# Patient Record
Sex: Female | Born: 1980 | Hispanic: No | Marital: Married | Smoking: Never smoker
Health system: Southern US, Community
[De-identification: ages and names within clinical notes are randomized; demographics above are authoritative.]

## PROBLEM LIST (undated history)

## (undated) DIAGNOSIS — Z789 Other specified health status: Secondary | ICD-10-CM

## (undated) HISTORY — PX: BACK SURGERY: SHX140

---

## 2017-10-27 ENCOUNTER — Other Ambulatory Visit: Payer: Self-pay | Admitting: Obstetrics and Gynecology

## 2017-10-27 ENCOUNTER — Other Ambulatory Visit (HOSPITAL_COMMUNITY)
Admission: RE | Admit: 2017-10-27 | Discharge: 2017-10-27 | Disposition: A | Discharge status: Home / Self Care | Payer: Self-pay | Source: Ambulatory Visit | Attending: Obstetrics and Gynecology | Admitting: Obstetrics and Gynecology

## 2017-10-27 DIAGNOSIS — Z01411 Encounter for gynecological examination (general) (routine) with abnormal findings: Secondary | ICD-10-CM | POA: Insufficient documentation

## 2017-10-29 LAB — CYTOLOGY - PAP
Chlamydia: NEGATIVE
DIAGNOSIS: NEGATIVE
HPV: NOT DETECTED
Neisseria Gonorrhea: NEGATIVE

## 2017-11-11 ENCOUNTER — Inpatient Hospital Stay (HOSPITAL_COMMUNITY): Admission: AD | Admit: 2017-11-11 | Payer: Self-pay | Source: Ambulatory Visit | Admitting: Obstetrics and Gynecology

## 2018-03-02 LAB — OB RESULTS CONSOLE ABO/RH: RH Type: POSITIVE

## 2018-03-02 LAB — OB RESULTS CONSOLE RPR: RPR: NONREACTIVE

## 2018-03-02 LAB — OB RESULTS CONSOLE ANTIBODY SCREEN: Antibody Screen: NEGATIVE

## 2018-03-02 LAB — OB RESULTS CONSOLE HEPATITIS B SURFACE ANTIGEN: Hepatitis B Surface Ag: NEGATIVE

## 2018-03-02 LAB — OB RESULTS CONSOLE HIV ANTIBODY (ROUTINE TESTING): HIV: NONREACTIVE

## 2018-03-02 LAB — OB RESULTS CONSOLE RUBELLA ANTIBODY, IGM: Rubella: IMMUNE

## 2018-05-01 LAB — OB RESULTS CONSOLE GBS: GBS: NEGATIVE

## 2018-05-18 ENCOUNTER — Other Ambulatory Visit: Payer: Self-pay

## 2018-05-18 ENCOUNTER — Encounter (HOSPITAL_COMMUNITY): Payer: Self-pay | Admitting: *Deleted

## 2018-05-18 ENCOUNTER — Other Ambulatory Visit: Payer: Self-pay | Admitting: Obstetrics and Gynecology

## 2018-05-18 ENCOUNTER — Inpatient Hospital Stay (HOSPITAL_COMMUNITY)
Admission: AD | Admit: 2018-05-18 | Discharge: 2018-05-22 | DRG: 788 | Disposition: A | Discharge status: Home / Self Care | Payer: BC Managed Care – PPO | Attending: Obstetrics and Gynecology | Admitting: Obstetrics and Gynecology

## 2018-05-18 DIAGNOSIS — Z3A39 39 weeks gestation of pregnancy: Secondary | ICD-10-CM | POA: Diagnosis not present

## 2018-05-18 DIAGNOSIS — O1404 Mild to moderate pre-eclampsia, complicating childbirth: Principal | ICD-10-CM | POA: Diagnosis present

## 2018-05-18 DIAGNOSIS — O99214 Obesity complicating childbirth: Secondary | ICD-10-CM | POA: Diagnosis present

## 2018-05-18 DIAGNOSIS — O1493 Unspecified pre-eclampsia, third trimester: Secondary | ICD-10-CM

## 2018-05-18 HISTORY — DX: Other specified health status: Z78.9

## 2018-05-18 LAB — COMPREHENSIVE METABOLIC PANEL
ALT: 18 U/L (ref 0–44)
ANION GAP: 9 (ref 5–15)
AST: 25 U/L (ref 15–41)
Albumin: 3 g/dL — ABNORMAL LOW (ref 3.5–5.0)
Alkaline Phosphatase: 87 U/L (ref 38–126)
BUN: 12 mg/dL (ref 6–20)
CO2: 21 mmol/L — ABNORMAL LOW (ref 22–32)
Calcium: 9.2 mg/dL (ref 8.9–10.3)
Chloride: 106 mmol/L (ref 98–111)
Creatinine, Ser: 0.6 mg/dL (ref 0.44–1.00)
GFR calc Af Amer: >60 mL/min (ref >60)
GFR calc non Af Amer: >60 mL/min (ref >60)
Glucose, Bld: 143 mg/dL — ABNORMAL HIGH (ref 70–99)
Potassium: 3.8 mmol/L (ref 3.5–5.1)
Sodium: 136 mmol/L (ref 135–145)
Total Bilirubin: 0.3 mg/dL (ref 0.3–1.2)
Total Protein: 6.2 g/dL — ABNORMAL LOW (ref 6.5–8.1)

## 2018-05-18 LAB — CBC
HCT: 40.4 % (ref 36.0–46.0)
Hemoglobin: 13.5 g/dL (ref 12.0–15.0)
MCH: 29.3 pg (ref 26.0–34.0)
MCHC: 33.4 g/dL (ref 30.0–36.0)
MCV: 87.6 fL (ref 80.0–100.0)
Platelets: 280 10*3/uL (ref 150–400)
RBC: 4.61 MIL/uL (ref 3.87–5.11)
RDW: 16.2 % — ABNORMAL HIGH (ref 11.5–15.5)
WBC: 11.1 10*3/uL — ABNORMAL HIGH (ref 4.0–10.5)
nRBC: 0 % (ref 0.0–0.2)

## 2018-05-18 LAB — PROTEIN / CREATININE RATIO, URINE
Creatinine, Urine: 142 mg/dL
Protein Creatinine Ratio: 0.32 mg/mg{Cre} — ABNORMAL HIGH (ref 0.00–0.15)
Total Protein, Urine: 45 mg/dL

## 2018-05-18 LAB — LACTATE DEHYDROGENASE: LDH: 137 U/L (ref 98–192)

## 2018-05-18 LAB — TYPE AND SCREEN
ABO/RH(D): A POS
ANTIBODY SCREEN: NEGATIVE

## 2018-05-18 LAB — URIC ACID: Uric Acid, Serum: 5.1 mg/dL (ref 2.5–7.1)

## 2018-05-18 LAB — ABO/RH: ABO/RH(D): A POS

## 2018-05-18 MED ORDER — OXYCODONE-ACETAMINOPHEN 5-325 MG PO TABS: 1.0000 | ORAL_TABLET | ORAL | Status: DC | PRN

## 2018-05-18 MED ORDER — PHENYLEPHRINE 40 MCG/ML (10ML) SYRINGE FOR IV PUSH (FOR BLOOD PRESSURE SUPPORT): 80.0000 ug | PREFILLED_SYRINGE | INTRAVENOUS | Status: DC | PRN

## 2018-05-18 MED ORDER — ONDANSETRON HCL 4 MG/2ML IJ SOLN: 4.0000 mg | Freq: Four times a day (QID) | INTRAMUSCULAR | Status: DC | PRN

## 2018-05-18 MED ORDER — LACTATED RINGERS IV SOLN
INTRAVENOUS | Status: DC
  Administered 2018-05-18: 125 mL/h via INTRAVENOUS
  Administered 2018-05-19 (×2): via INTRAVENOUS

## 2018-05-18 MED ORDER — PHENYLEPHRINE 40 MCG/ML (10ML) SYRINGE FOR IV PUSH (FOR BLOOD PRESSURE SUPPORT)
80.0000 ug | PREFILLED_SYRINGE | INTRAVENOUS | Status: DC | PRN
  Filled 2018-05-18: qty 10

## 2018-05-18 MED ORDER — FENTANYL CITRATE (PF) 100 MCG/2ML IJ SOLN
50.0000 ug | INTRAMUSCULAR | Status: DC | PRN
  Administered 2018-05-19 (×2): 100 ug via INTRAVENOUS
  Filled 2018-05-18 (×2): qty 2

## 2018-05-18 MED ORDER — LIDOCAINE HCL (PF) 1 % IJ SOLN: 30.0000 mL | INTRAMUSCULAR | Status: DC | PRN

## 2018-05-18 MED ORDER — DIPHENHYDRAMINE HCL 50 MG/ML IJ SOLN: 12.5000 mg | INTRAMUSCULAR | Status: DC | PRN

## 2018-05-18 MED ORDER — SOD CITRATE-CITRIC ACID 500-334 MG/5ML PO SOLN
30.0000 mL | ORAL | Status: DC | PRN
  Filled 2018-05-18: qty 15

## 2018-05-18 MED ORDER — TERBUTALINE SULFATE 1 MG/ML IJ SOLN
0.2500 mg | Freq: Once | INTRAMUSCULAR | Status: AC | PRN
  Administered 2018-05-19: 0.25 mg via SUBCUTANEOUS
  Filled 2018-05-18: qty 1

## 2018-05-18 MED ORDER — HYDRALAZINE HCL 20 MG/ML IJ SOLN: 10.0000 mg | INTRAMUSCULAR | Status: DC | PRN

## 2018-05-18 MED ORDER — LABETALOL HCL 5 MG/ML IV SOLN
20.0000 mg | INTRAVENOUS | Status: DC | PRN
  Administered 2018-05-19: 20 mg via INTRAVENOUS
  Filled 2018-05-18: qty 4

## 2018-05-18 MED ORDER — EPHEDRINE 5 MG/ML INJ: 10.0000 mg | INTRAVENOUS | Status: DC | PRN

## 2018-05-18 MED ORDER — ACETAMINOPHEN 325 MG PO TABS: 650.0000 mg | ORAL_TABLET | ORAL | Status: DC | PRN

## 2018-05-18 MED ORDER — FENTANYL 2.5 MCG/ML BUPIVACAINE 1/10 % EPIDURAL INFUSION (WH - ANES)
14.0000 mL/h | INTRAMUSCULAR | Status: DC | PRN
  Filled 2018-05-18: qty 100

## 2018-05-18 MED ORDER — MISOPROSTOL 25 MCG QUARTER TABLET
25.0000 ug | ORAL_TABLET | ORAL | Status: DC | PRN
  Administered 2018-05-18 – 2018-05-19 (×3): 25 ug via VAGINAL
  Filled 2018-05-18 (×3): qty 1

## 2018-05-18 MED ORDER — OXYTOCIN BOLUS FROM INFUSION: 500.0000 mL | Freq: Once | INTRAVENOUS | Status: DC

## 2018-05-18 MED ORDER — LABETALOL HCL 5 MG/ML IV SOLN: 80.0000 mg | INTRAVENOUS | Status: DC | PRN

## 2018-05-18 MED ORDER — LACTATED RINGERS IV SOLN: 500.0000 mL | Freq: Once | INTRAVENOUS | Status: DC

## 2018-05-18 MED ORDER — ZOLPIDEM TARTRATE 5 MG PO TABS: 5.0000 mg | ORAL_TABLET | Freq: Every day | ORAL | Status: DC

## 2018-05-18 MED ORDER — LACTATED RINGERS IV SOLN
500.0000 mL | INTRAVENOUS | Status: DC | PRN
  Administered 2018-05-19: 500 mL via INTRAVENOUS

## 2018-05-18 MED ORDER — LABETALOL HCL 5 MG/ML IV SOLN
40.0000 mg | INTRAVENOUS | Status: DC | PRN
  Administered 2018-05-19: 40 mg via INTRAVENOUS
  Filled 2018-05-18: qty 8

## 2018-05-18 MED ORDER — OXYTOCIN 40 UNITS IN NORMAL SALINE INFUSION - SIMPLE MED: 2.5000 [IU]/h | INTRAVENOUS | Status: DC

## 2018-05-18 MED ORDER — OXYCODONE-ACETAMINOPHEN 5-325 MG PO TABS: 2.0000 | ORAL_TABLET | ORAL | Status: DC | PRN

## 2018-05-18 NOTE — H&P (Signed)
Cristina Fisher is a 38 y.o. female G1 P0 at 39 wks and 4 days  presenting for induction of labor due to preeclampsia. Pt was seen in the office on 05/15/2018 at which time her bp was 140/88. PCR was 698. Short interval follow up was arranged for today and bp in the office today was 144/98. She denies headache visual disturbances or ruq pain. Pregnancy has been uncomplicated. Prenatal care provided by Dr. Gerald Leitzara Fuad Fisher with [facility]. . OB History    Gravida  1   Para      Term      Preterm      AB      Living        SAB      TAB      Ectopic      Multiple      Live Births             Past Medical History:  Diagnosis Date  . Medical history non-contributory    Past Surgical History:  Procedure Laterality Date  . BACK SURGERY     Family History: family history is not on file. Social History:  reports that she has never smoked. She does not have any smokeless tobacco history on file. She reports that she does not drink alcohol or use drugs.     Maternal Diabetes: No Genetic Screening: Normal Maternal Ultrasounds/Referrals: Normal Fetal Ultrasounds or other Referrals:  None Maternal Substance Abuse:  No Significant Maternal Medications:  None Significant Maternal Lab Results:  Lab values include: Group B Strep negative Other Comments:  None  ROS Maternal Medical History:  Fetal activity: Perceived fetal activity is normal.    Prenatal complications: Pre-eclampsia.   Prenatal Complications - Diabetes: none.      Blood pressure (!) 149/85, pulse (!) 101, temperature 98.6 F (37 C), temperature source Oral, resp. rate 16, height 5\' 3"  (1.6 m), weight 105.2 kg.   Fetal Exam Fetal Monitor Review: Baseline rate: 135.  Variability: moderate (6-25 bpm).   Pattern: accelerations present and no decelerations.    Fetal State Assessment: Category I - tracings are normal.     Physical Exam  Vitals reviewed. Constitutional: She is oriented to person, place, and  time. She appears well-developed and well-nourished.  HENT:  Head: Normocephalic and atraumatic.  Eyes: Pupils are equal, round, and reactive to light. Conjunctivae are normal.  Neck: Normal range of motion. Neck supple.  Cardiovascular: Normal rate and regular rhythm.  Respiratory: Effort normal and breath sounds normal.  GI: There is no abdominal tenderness.  Genitourinary:    Vagina normal.   Musculoskeletal: Normal range of motion.        General: Edema present.  Neurological: She is alert and oriented to person, place, and time. She has normal reflexes.  Skin: Skin is warm and dry.  Psychiatric: She has a normal mood and affect.   Cervix Closed thick and high vertex presentation.   Prenatal labs: ABO, Rh: --/--/A POS (01/20 1747) Antibody: PENDING (01/20 1747) Rubella: Immune (11/04 0000) RPR: Nonreactive (11/04 0000)  HBsAg: Negative (11/04 0000)  HIV: Non-reactive (11/04 0000)  GBS: Negative (01/03 0000)   Results for orders placed or performed during the hospital encounter of 05/18/18 (from the past 24 hour(s))  CBC     Status: Abnormal   Collection Time: 05/18/18  5:47 PM  Result Value Ref Range   WBC 11.1 (H) 4.0 - 10.5 K/uL   RBC 4.61 3.87 - 5.11 MIL/uL  Hemoglobin 13.5 12.0 - 15.0 g/dL   HCT 16.140.4 09.636.0 - 04.546.0 %   MCV 87.6 80.0 - 100.0 fL   MCH 29.3 26.0 - 34.0 pg   MCHC 33.4 30.0 - 36.0 g/dL   RDW 40.916.2 (H) 81.111.5 - 91.415.5 %   Platelets 280 150 - 400 K/uL   nRBC 0.0 0.0 - 0.2 %  Comprehensive metabolic panel     Status: Abnormal   Collection Time: 05/18/18  5:47 PM  Result Value Ref Range   Sodium 136 135 - 145 mmol/L   Potassium 3.8 3.5 - 5.1 mmol/L   Chloride 106 98 - 111 mmol/L   CO2 21 (L) 22 - 32 mmol/L   Glucose, Bld 143 (H) 70 - 99 mg/dL   BUN 12 6 - 20 mg/dL   Creatinine, Ser 7.820.60 0.44 - 1.00 mg/dL   Calcium 9.2 8.9 - 95.610.3 mg/dL   Total Protein 6.2 (L) 6.5 - 8.1 g/dL   Albumin 3.0 (L) 3.5 - 5.0 g/dL   AST 25 15 - 41 U/L   ALT 18 0 - 44 U/L    Alkaline Phosphatase 87 38 - 126 U/L   Total Bilirubin 0.3 0.3 - 1.2 mg/dL   GFR calc non Af Amer >60 >60 mL/min   GFR calc Af Amer >60 >60 mL/min   Anion gap 9 5 - 15  Lactate dehydrogenase     Status: None   Collection Time: 05/18/18  5:47 PM  Result Value Ref Range   LDH 137 98 - 192 U/L  Type and screen     Status: None (Preliminary result)   Collection Time: 05/18/18  5:47 PM  Result Value Ref Range   ABO/RH(D) A POS    Antibody Screen PENDING    Sample Expiration      05/21/2018 Performed at [facility], [address] [place], [place] [id number]   Uric acid     Status: None   Collection Time: 05/18/18  5:47 PM  Result Value Ref Range   Uric Acid, Serum 5.1 2.5 - 7.1 mg/dL   Assessment/Plan: 39 wks and 4 days with preeclampsia for inductio Cytotec for cervical ripening. First dose placed at  6pm.  Preeclampsia without severe features.. IV labetalol order in the event of severe range bp.. if this occurs start Magnesium sulfate.. plan magnesium sulfate post delivery for 24 hours.  [facility] .Dr. Su Fisher and Cristina Fisher LPJade Fisher CNM covering after 7pm.. I will assume care at 7 am tomorrow   Cristina Fisher 05/18/2018, 6:43 PM

## 2018-05-18 NOTE — Progress Notes (Addendum)
Labor Progress Note  Per HPI: [place] Hatz is a 38 y.o. female G1 P0 at 39 wks and 4 days  presenting for induction of labor due to preeclampsia. Pt was seen in the office on 05/15/2018 at which time her bp was 140/88. PCR was 698. Short interval follow up was arranged for today and bp in the office today was 144/98. She denies headache visual disturbances or ruq pain. Pregnancy has been uncomplicated. Prenatal care provided by Dr. Gerald Leitz with [facility].   Subjective: Pt resting quietly in bed with husband at bedside, reviewed risk and benefits or Cytotec and foley bulb, reviewed the induction process. Pt verbalized understanding, pt stable. Pt endorses feeling light cxt, but tolerates well.  Patient Active Problem List   Diagnosis Date Noted  . Preeclampsia, third trimester 05/18/2018   Objective: BP (!) 153/82   Pulse 90   Temp 98.9 F (37.2 C) (Oral)   Resp 18   Ht 5\' 3"  (1.6 m)   Wt 105.2 kg   BMI 41.10 kg/m  No intake/output data recorded. No intake/output data recorded. NST: FHR baseline 120 bpm, Variability: moderate, Accelerations:present, Decelerations:  Absent= Cat 1/Reactive CTX:  irregular, every 3.5-8 minutes, lasting 60-90 seconds Uterus gravid, soft non tender, moderate to palpate with contractions.  SVE:  Dilation: Fingertip Effacement (%): Thick Station: Ballotable Exam by:: Casey Burkitt, RN Pitocin at ( Not started yet) mUn/min  Assessment:  Joella Prince, 38 y.o., G1P0, with an IUP @ [redacted]w[redacted]d, presented for IOL for preeclampsia without SF. Pt stable and resting in bed, mild cxt felt.  Patient Active Problem List   Diagnosis Date Noted  . Preeclampsia, third trimester 05/18/2018   NICHD: Category 1  Membranes:  Intact, no s/s of infection  Induction:    Cytotec x1 @ 1800 & 2200 on 1/20  Foley Bulb: not yet  Pitocin - Not started   Pain management:               IV pain management: PRN  Nitrous: PRN             Epidural placement:  PRN  GBS  Negative   Preeclampsia without SF: BP 152/83, asymptomic  Plan: Continue labor plan Continuous/intermittent monitoring Rest Ambulate Frequent position changes to facilitate fetal rotation and descent. Will reassess with cervical exam as necessary Preeclampsia without SF: Monitor BP, labetalol protocol PRN, start magnesium if SF present with AH, or severe range BP >160/110, pt to received PP magnesium for 24 hours per Dr Richardson Dopp. Repeat labs in AM.  Plan to place Cytotec Q4H x3 as indicated, anticipate foley biulb and aticipate pitocin per protocol once cervix is ripe. Anticipate labor progression and vaginal delivery.   Md Su Hilt to be updated PRN  Dale [place], NP-C, CNM, MSN 05/19/2018. 12:09 AM

## 2018-05-19 ENCOUNTER — Encounter (HOSPITAL_COMMUNITY)
Admission: AD | Disposition: A | Discharge status: Home / Self Care | Payer: Self-pay | Source: Home / Self Care | Attending: Obstetrics and Gynecology

## 2018-05-19 ENCOUNTER — Inpatient Hospital Stay (HOSPITAL_COMMUNITY): Payer: BC Managed Care – PPO | Admitting: Anesthesiology

## 2018-05-19 ENCOUNTER — Other Ambulatory Visit: Payer: Self-pay

## 2018-05-19 ENCOUNTER — Encounter (HOSPITAL_COMMUNITY): Payer: Self-pay | Admitting: *Deleted

## 2018-05-19 LAB — COMPREHENSIVE METABOLIC PANEL WITH GFR
ALT: 18 U/L (ref 0–44)
AST: 32 U/L (ref 15–41)
Albumin: 2.7 g/dL — ABNORMAL LOW (ref 3.5–5.0)
Alkaline Phosphatase: 80 U/L (ref 38–126)
Anion gap: 9 (ref 5–15)
BUN: 10 mg/dL (ref 6–20)
CO2: 21 mmol/L — ABNORMAL LOW (ref 22–32)
Calcium: 7.9 mg/dL — ABNORMAL LOW (ref 8.9–10.3)
Chloride: 103 mmol/L (ref 98–111)
Creatinine, Ser: 0.75 mg/dL (ref 0.44–1.00)
GFR calc Af Amer: >60 mL/min
GFR calc non Af Amer: >60 mL/min
Glucose, Bld: 200 mg/dL — ABNORMAL HIGH (ref 70–99)
Potassium: 4 mmol/L (ref 3.5–5.1)
Sodium: 133 mmol/L — ABNORMAL LOW (ref 135–145)
Total Bilirubin: 0.5 mg/dL (ref 0.3–1.2)
Total Protein: 5.6 g/dL — ABNORMAL LOW (ref 6.5–8.1)

## 2018-05-19 LAB — CBC
HCT: 40.4 % (ref 36.0–46.0)
HCT: 38 % (ref 36.0–46.0)
Hemoglobin: 13.4 g/dL (ref 12.0–15.0)
Hemoglobin: 12.4 g/dL (ref 12.0–15.0)
MCH: 28.8 pg (ref 26.0–34.0)
MCH: 28.6 pg (ref 26.0–34.0)
MCHC: 33.2 g/dL (ref 30.0–36.0)
MCHC: 32.6 g/dL (ref 30.0–36.0)
MCV: 86.9 fL (ref 80.0–100.0)
MCV: 87.8 fL (ref 80.0–100.0)
Platelets: 239 10*3/uL (ref 150–400)
Platelets: 241 10*3/uL (ref 150–400)
RBC: 4.65 MIL/uL (ref 3.87–5.11)
RBC: 4.33 MIL/uL (ref 3.87–5.11)
RDW: 16.3 % — ABNORMAL HIGH (ref 11.5–15.5)
RDW: 16.1 % — ABNORMAL HIGH (ref 11.5–15.5)
WBC: 12.7 10*3/uL — ABNORMAL HIGH (ref 4.0–10.5)
WBC: 20 10*3/uL — ABNORMAL HIGH (ref 4.0–10.5)
nRBC: 0 % (ref 0.0–0.2)
nRBC: 0 % (ref 0.0–0.2)

## 2018-05-19 LAB — RPR: RPR Ser Ql: NONREACTIVE

## 2018-05-19 SURGERY — Surgical Case
Anesthesia: Epidural

## 2018-05-19 MED ORDER — KETOROLAC TROMETHAMINE 30 MG/ML IJ SOLN
30.0000 mg | Freq: Once | INTRAMUSCULAR | Status: AC | PRN
  Administered 2018-05-19: 30 mg via INTRAVENOUS

## 2018-05-19 MED ORDER — MORPHINE SULFATE (PF) 0.5 MG/ML IJ SOLN
INTRAMUSCULAR | Status: DC | PRN
  Administered 2018-05-19: 3 mg via EPIDURAL

## 2018-05-19 MED ORDER — SUCCINYLCHOLINE CHLORIDE 200 MG/10ML IV SOSY
PREFILLED_SYRINGE | INTRAVENOUS | Status: AC
  Filled 2018-05-19: qty 10

## 2018-05-19 MED ORDER — MIDAZOLAM HCL 2 MG/2ML IJ SOLN
INTRAMUSCULAR | Status: AC
  Filled 2018-05-19: qty 2

## 2018-05-19 MED ORDER — COCONUT OIL OIL: 1.0000 "application " | TOPICAL_OIL | Status: DC | PRN

## 2018-05-19 MED ORDER — PROPOFOL 10 MG/ML IV BOLUS
INTRAVENOUS | Status: DC | PRN
  Administered 2018-05-19: 200 mg via INTRAVENOUS

## 2018-05-19 MED ORDER — KETOROLAC TROMETHAMINE 30 MG/ML IJ SOLN
INTRAMUSCULAR | Status: AC
  Filled 2018-05-19: qty 1

## 2018-05-19 MED ORDER — MAGNESIUM SULFATE 40 G IN LACTATED RINGERS - SIMPLE
INTRAVENOUS | Status: AC
  Filled 2018-05-19: qty 500

## 2018-05-19 MED ORDER — EPHEDRINE 5 MG/ML INJ: 10.0000 mg | INTRAVENOUS | Status: DC | PRN

## 2018-05-19 MED ORDER — MORPHINE SULFATE (PF) 0.5 MG/ML IJ SOLN
INTRAMUSCULAR | Status: AC
  Filled 2018-05-19: qty 10

## 2018-05-19 MED ORDER — LABETALOL HCL 5 MG/ML IV SOLN: 20.0000 mg | INTRAVENOUS | Status: DC | PRN

## 2018-05-19 MED ORDER — HYDROMORPHONE HCL 1 MG/ML IJ SOLN: 0.2500 mg | INTRAMUSCULAR | Status: DC | PRN

## 2018-05-19 MED ORDER — ONDANSETRON HCL 4 MG/2ML IJ SOLN
INTRAMUSCULAR | Status: AC
  Filled 2018-05-19: qty 2

## 2018-05-19 MED ORDER — HYDRALAZINE HCL 20 MG/ML IJ SOLN: 10.0000 mg | INTRAMUSCULAR | Status: DC | PRN

## 2018-05-19 MED ORDER — ONDANSETRON HCL 4 MG/2ML IJ SOLN
INTRAMUSCULAR | Status: DC | PRN
  Administered 2018-05-19: 4 mg via INTRAVENOUS

## 2018-05-19 MED ORDER — LACTATED RINGERS IV SOLN
INTRAVENOUS | Status: DC | PRN
  Administered 2018-05-19 (×2): via INTRAVENOUS

## 2018-05-19 MED ORDER — SODIUM BICARBONATE 8.4 % IV SOLN
INTRAVENOUS | Status: AC
  Filled 2018-05-19: qty 50

## 2018-05-19 MED ORDER — PHENYLEPHRINE 40 MCG/ML (10ML) SYRINGE FOR IV PUSH (FOR BLOOD PRESSURE SUPPORT)
PREFILLED_SYRINGE | INTRAVENOUS | Status: AC
  Filled 2018-05-19: qty 110

## 2018-05-19 MED ORDER — FENTANYL CITRATE (PF) 250 MCG/5ML IJ SOLN
INTRAMUSCULAR | Status: DC | PRN
  Administered 2018-05-19: 100 ug via INTRAVENOUS

## 2018-05-19 MED ORDER — FENTANYL 2.5 MCG/ML BUPIVACAINE 1/10 % EPIDURAL INFUSION (WH - ANES): 14.0000 mL/h | INTRAMUSCULAR | Status: DC | PRN

## 2018-05-19 MED ORDER — ZOLPIDEM TARTRATE 5 MG PO TABS: 5.0000 mg | ORAL_TABLET | Freq: Every evening | ORAL | Status: DC | PRN

## 2018-05-19 MED ORDER — SCOPOLAMINE 1 MG/3DAYS TD PT72
MEDICATED_PATCH | TRANSDERMAL | Status: DC | PRN
  Administered 2018-05-19: 1 via TRANSDERMAL

## 2018-05-19 MED ORDER — SENNOSIDES-DOCUSATE SODIUM 8.6-50 MG PO TABS
2.0000 | ORAL_TABLET | ORAL | Status: DC
  Administered 2018-05-19 – 2018-05-20 (×2): 2 via ORAL
  Filled 2018-05-19 (×3): qty 2

## 2018-05-19 MED ORDER — DIBUCAINE 1 % RE OINT: 1.0000 "application " | TOPICAL_OINTMENT | RECTAL | Status: DC | PRN

## 2018-05-19 MED ORDER — ACETAMINOPHEN 10 MG/ML IV SOLN
1000.0000 mg | Freq: Once | INTRAVENOUS | Status: AC
  Administered 2018-05-19: 1000 mg via INTRAVENOUS

## 2018-05-19 MED ORDER — ONDANSETRON HCL 4 MG PO TABS: 4.0000 mg | ORAL_TABLET | Freq: Four times a day (QID) | ORAL | Status: DC | PRN

## 2018-05-19 MED ORDER — ONDANSETRON HCL 4 MG/2ML IJ SOLN: 4.0000 mg | Freq: Four times a day (QID) | INTRAMUSCULAR | Status: DC | PRN

## 2018-05-19 MED ORDER — PRENATAL MULTIVITAMIN CH
1.0000 | ORAL_TABLET | Freq: Every day | ORAL | Status: DC
  Administered 2018-05-20 – 2018-05-22 (×3): 1 via ORAL
  Filled 2018-05-19 (×3): qty 1

## 2018-05-19 MED ORDER — PHENYLEPHRINE 40 MCG/ML (10ML) SYRINGE FOR IV PUSH (FOR BLOOD PRESSURE SUPPORT): 80.0000 ug | PREFILLED_SYRINGE | INTRAVENOUS | Status: DC | PRN

## 2018-05-19 MED ORDER — OXYTOCIN 40 UNITS IN NORMAL SALINE INFUSION - SIMPLE MED: 2.5000 [IU]/h | INTRAVENOUS | Status: AC

## 2018-05-19 MED ORDER — FERROUS SULFATE 325 (65 FE) MG PO TABS
325.0000 mg | ORAL_TABLET | Freq: Two times a day (BID) | ORAL | Status: DC
  Administered 2018-05-19 – 2018-05-22 (×6): 325 mg via ORAL
  Filled 2018-05-19 (×6): qty 1

## 2018-05-19 MED ORDER — CEFAZOLIN SODIUM-DEXTROSE 2-4 GM/100ML-% IV SOLN
INTRAVENOUS | Status: AC
  Filled 2018-05-19: qty 100

## 2018-05-19 MED ORDER — MENTHOL 3 MG MT LOZG: 1.0000 | LOZENGE | OROMUCOSAL | Status: DC | PRN

## 2018-05-19 MED ORDER — DIPHENHYDRAMINE HCL 50 MG/ML IJ SOLN: 12.5000 mg | INTRAMUSCULAR | Status: DC | PRN

## 2018-05-19 MED ORDER — LACTATED RINGERS IV SOLN: 500.0000 mL | Freq: Once | INTRAVENOUS | Status: DC

## 2018-05-19 MED ORDER — DEXAMETHASONE SODIUM PHOSPHATE 10 MG/ML IJ SOLN
INTRAMUSCULAR | Status: AC
  Filled 2018-05-19: qty 1

## 2018-05-19 MED ORDER — OXYCODONE HCL 5 MG PO TABS
5.0000 mg | ORAL_TABLET | ORAL | Status: DC | PRN
  Administered 2018-05-20 – 2018-05-21 (×3): 10 mg via ORAL
  Administered 2018-05-22: 5 mg via ORAL
  Filled 2018-05-19: qty 2
  Filled 2018-05-19: qty 1
  Filled 2018-05-19 (×2): qty 2

## 2018-05-19 MED ORDER — PROMETHAZINE HCL 25 MG/ML IJ SOLN: 6.2500 mg | INTRAMUSCULAR | Status: DC | PRN

## 2018-05-19 MED ORDER — LABETALOL HCL 5 MG/ML IV SOLN: 40.0000 mg | INTRAVENOUS | Status: DC | PRN

## 2018-05-19 MED ORDER — HYDROMORPHONE HCL 1 MG/ML IJ SOLN: 0.2000 mg | INTRAMUSCULAR | Status: DC | PRN

## 2018-05-19 MED ORDER — MAGNESIUM SULFATE 40 G IN LACTATED RINGERS - SIMPLE
2.0000 g/h | INTRAVENOUS | Status: DC
  Administered 2018-05-19: 2 g/h via INTRAVENOUS

## 2018-05-19 MED ORDER — IBUPROFEN 800 MG PO TABS
800.0000 mg | ORAL_TABLET | Freq: Three times a day (TID) | ORAL | Status: AC
  Administered 2018-05-19 – 2018-05-22 (×9): 800 mg via ORAL
  Filled 2018-05-19 (×9): qty 1

## 2018-05-19 MED ORDER — CEFAZOLIN SODIUM-DEXTROSE 2-3 GM-%(50ML) IV SOLR
INTRAVENOUS | Status: DC | PRN
  Administered 2018-05-19: 2 g via INTRAVENOUS

## 2018-05-19 MED ORDER — PROPOFOL 10 MG/ML IV BOLUS
INTRAVENOUS | Status: AC
  Filled 2018-05-19: qty 20

## 2018-05-19 MED ORDER — MAGNESIUM SULFATE BOLUS VIA INFUSION
4.0000 g | Freq: Once | INTRAVENOUS | Status: AC
  Administered 2018-05-19: 4 g via INTRAVENOUS
  Filled 2018-05-19: qty 500

## 2018-05-19 MED ORDER — ACETAMINOPHEN 10 MG/ML IV SOLN
INTRAVENOUS | Status: AC
  Filled 2018-05-19: qty 100

## 2018-05-19 MED ORDER — WITCH HAZEL-GLYCERIN EX PADS: 1.0000 "application " | MEDICATED_PAD | CUTANEOUS | Status: DC | PRN

## 2018-05-19 MED ORDER — SODIUM CHLORIDE 0.9 % IR SOLN
Status: DC | PRN
  Administered 2018-05-19: 1000 mL

## 2018-05-19 MED ORDER — PHENYLEPHRINE HCL 10 MG/ML IJ SOLN
INTRAMUSCULAR | Status: DC | PRN
  Administered 2018-05-19 (×10): 80 ug via INTRAVENOUS

## 2018-05-19 MED ORDER — LACTATED RINGERS IV SOLN
INTRAVENOUS | Status: DC
  Administered 2018-05-19 – 2018-05-20 (×2): via INTRAVENOUS

## 2018-05-19 MED ORDER — DEXAMETHASONE SODIUM PHOSPHATE 10 MG/ML IJ SOLN
INTRAMUSCULAR | Status: DC | PRN
  Administered 2018-05-19: 5 mg via INTRAVENOUS

## 2018-05-19 MED ORDER — SIMETHICONE 80 MG PO CHEW
80.0000 mg | CHEWABLE_TABLET | ORAL | Status: DC
  Administered 2018-05-19 – 2018-05-22 (×3): 80 mg via ORAL
  Filled 2018-05-19 (×3): qty 1

## 2018-05-19 MED ORDER — SIMETHICONE 80 MG PO CHEW
80.0000 mg | CHEWABLE_TABLET | Freq: Three times a day (TID) | ORAL | Status: DC
  Administered 2018-05-19 – 2018-05-22 (×9): 80 mg via ORAL
  Filled 2018-05-19 (×9): qty 1

## 2018-05-19 MED ORDER — LABETALOL HCL 5 MG/ML IV SOLN: 80.0000 mg | INTRAVENOUS | Status: DC | PRN

## 2018-05-19 MED ORDER — SCOPOLAMINE 1 MG/3DAYS TD PT72
MEDICATED_PATCH | TRANSDERMAL | Status: AC
  Filled 2018-05-19: qty 1

## 2018-05-19 MED ORDER — FENTANYL CITRATE (PF) 250 MCG/5ML IJ SOLN
INTRAMUSCULAR | Status: AC
  Filled 2018-05-19: qty 5

## 2018-05-19 MED ORDER — SIMETHICONE 80 MG PO CHEW: 80.0000 mg | CHEWABLE_TABLET | ORAL | Status: DC | PRN

## 2018-05-19 MED ORDER — OXYTOCIN 10 UNIT/ML IJ SOLN
INTRAVENOUS | Status: DC | PRN
  Administered 2018-05-19: 40 [IU] via INTRAVENOUS

## 2018-05-19 MED ORDER — MEPERIDINE HCL 25 MG/ML IJ SOLN: 6.2500 mg | INTRAMUSCULAR | Status: DC | PRN

## 2018-05-19 MED ORDER — DIPHENHYDRAMINE HCL 25 MG PO CAPS: 25.0000 mg | ORAL_CAPSULE | Freq: Four times a day (QID) | ORAL | Status: DC | PRN

## 2018-05-19 MED ORDER — SUCCINYLCHOLINE CHLORIDE 20 MG/ML IJ SOLN
INTRAMUSCULAR | Status: DC | PRN
  Administered 2018-05-19: 200 mg via INTRAVENOUS

## 2018-05-19 MED ORDER — LIDOCAINE-EPINEPHRINE (PF) 2 %-1:200000 IJ SOLN
INTRAMUSCULAR | Status: AC
  Filled 2018-05-19: qty 20

## 2018-05-19 MED ORDER — LIDOCAINE-EPINEPHRINE (PF) 2 %-1:200000 IJ SOLN
INTRAMUSCULAR | Status: DC | PRN
  Administered 2018-05-19 (×2): 5 mL via EPIDURAL
  Administered 2018-05-19: 3 mL via EPIDURAL

## 2018-05-19 MED ORDER — STERILE WATER FOR IRRIGATION IR SOLN
Status: DC | PRN
  Administered 2018-05-19: 1000 mL

## 2018-05-19 SURGICAL SUPPLY — 43 items
BARRIER ADHS 3X4 INTERCEED (GAUZE/BANDAGES/DRESSINGS) ×2 IMPLANT
BENZOIN TINCTURE PRP APPL 2/3 (GAUZE/BANDAGES/DRESSINGS) ×2 IMPLANT
CANISTER SUCT 3000ML PPV (MISCELLANEOUS) ×2 IMPLANT
CHLORAPREP W/TINT 26ML (MISCELLANEOUS) ×2 IMPLANT
DRSG OPSITE POSTOP 4X10 (GAUZE/BANDAGES/DRESSINGS) ×2 IMPLANT
ELECT REM PT RETURN 9FT ADLT (ELECTROSURGICAL) ×2
ELECTRODE REM PT RTRN 9FT ADLT (ELECTROSURGICAL) ×1 IMPLANT
EXTRACTOR VACUUM KIWI (MISCELLANEOUS) ×2 IMPLANT
GAUZE SPONGE 4X4 12PLY STRL LF (GAUZE/BANDAGES/DRESSINGS) ×4 IMPLANT
GLOVE BIOGEL PI IND STRL 7.0 (GLOVE) ×2 IMPLANT
GLOVE BIOGEL PI IND STRL 7.5 (GLOVE) ×1 IMPLANT
GLOVE BIOGEL PI INDICATOR 7.0 (GLOVE) ×2
GLOVE BIOGEL PI INDICATOR 7.5 (GLOVE) ×1
GLOVE SKINSENSE NS SZ7.0 (GLOVE) ×1
GLOVE SKINSENSE STRL SZ7.0 (GLOVE) ×1 IMPLANT
GOWN STRL REUS W/ TWL LRG LVL3 (GOWN DISPOSABLE) ×2 IMPLANT
GOWN STRL REUS W/ TWL XL LVL3 (GOWN DISPOSABLE) ×1 IMPLANT
GOWN STRL REUS W/TWL LRG LVL3 (GOWN DISPOSABLE) ×2
GOWN STRL REUS W/TWL XL LVL3 (GOWN DISPOSABLE) ×1
NEEDLE HYPO 25X5/8 SAFETYGLIDE (NEEDLE) ×2 IMPLANT
NS IRRIG 1000ML POUR BTL (IV SOLUTION) ×2 IMPLANT
PACK C SECTION WH (CUSTOM PROCEDURE TRAY) ×2 IMPLANT
PAD ABD 7.5X8 STRL (GAUZE/BANDAGES/DRESSINGS) ×2 IMPLANT
PAD OB MATERNITY 4.3X12.25 (PERSONAL CARE ITEMS) ×2 IMPLANT
PAD PREP 24X48 CUFFED NSTRL (MISCELLANEOUS) ×2 IMPLANT
PENCIL SMOKE EVAC W/HOLSTER (ELECTROSURGICAL) ×2 IMPLANT
SPONGE LAP 18X18 RF (DISPOSABLE) ×6 IMPLANT
SPONGE LAP 18X18 X RAY DECT (DISPOSABLE) ×6 IMPLANT
STRIP CLOSURE SKIN 1/2X4 (GAUZE/BANDAGES/DRESSINGS) ×2 IMPLANT
SUT MNCRL 0 VIOLET CTX 36 (SUTURE) ×2 IMPLANT
SUT MON AB 4-0 PS1 27 (SUTURE) ×2 IMPLANT
SUT MONOCRYL 0 CTX 36 (SUTURE) ×2
SUT PDS AB 0 CT1 27 (SUTURE) ×4 IMPLANT
SUT PLAIN 2 0 XLH (SUTURE) ×2 IMPLANT
SUT VIC AB 0 CT1 36 (SUTURE) ×4 IMPLANT
SUT VIC AB 0 CTX 36 (SUTURE) ×3
SUT VIC AB 0 CTX36XBRD ANBCTRL (SUTURE) ×3 IMPLANT
SUT VIC AB 2-0 CT1 27 (SUTURE) ×1
SUT VIC AB 2-0 CT1 TAPERPNT 27 (SUTURE) ×1 IMPLANT
SUT VIC AB 3-0 CT1 27 (SUTURE) ×1
SUT VIC AB 3-0 CT1 TAPERPNT 27 (SUTURE) ×1 IMPLANT
SUT VIC AB 4-0 KS 27 (SUTURE) ×2 IMPLANT
TOWEL OR 17X24 6PK STRL BLUE (TOWEL DISPOSABLE) ×4 IMPLANT

## 2018-05-19 NOTE — Progress Notes (Signed)
Late entry  CTSP for prolonged decel. Fetus in the 43s for approximately 10-21m when I got there and had just been given terbutaline. No pitocin running, belly soft, BP normal and SVE complete/+1 to +2. Decision made to proceed with stat c-section and reasess in the room. In the OR on entry, fetus still in the 80s and about a 90 sec later just prior to prepping and draping, fetus still in the 80s-90s. Dr. Richardson Dopp present now and I assisted with the stat c-section. See op note for full details  Cornelia Copa MD Attending Center for [employer] (Faculty Practice) 05/19/2018 Time: [id number]

## 2018-05-19 NOTE — Transfer of Care (Addendum)
Immediate Anesthesia Transfer of Care Note  Patient: Cristina Fisher  Procedure(s) Performed: CESAREAN SECTION (N/A )  Patient Location: PACU  Anesthesia Type:General and epidural  Level of Consciousness: awake and alert   Airway & Oxygen Therapy: Patient Spontanous Breathing and Patient connected to nasal cannula oxygen  Post-op Assessment: Report given to RN and Post -op Vital signs reviewed and stable  Post vital signs: Reviewed and stable  Last Vitals:  Vitals Value Taken Time  BP 106/73 05/19/2018  9:20 AM  Temp    Pulse 105 05/19/2018  9:28 AM  Resp 22 05/19/2018  9:28 AM  SpO2 100 % 05/19/2018  9:28 AM  Vitals shown include unvalidated device data.  Last Pain:  Vitals:   05/19/18 0915  TempSrc:   PainSc: 0-No pain         Complications: No apparent anesthesia complications HR 108, RR 16, SaO2 100%, BP 106/73

## 2018-05-19 NOTE — Consult Note (Signed)
Neonatology Note:   Attendance at C-section:    I was asked by Dr. Cole to attend this CODE C/S at term for fetal bradycardia. The mother is a G1, GBS neg with good prenatal care complicated by IOL for pre-eclampsia. ROM 2 hours before delivery, fluid clear. Mother placed under general anesthesia.  HR in 80s just prior to delivery.  Infant not vigorous nor with good spontaneous cry and tone. Cord immediately cut and baby brought to warmer where he was dried and stimulated.  No response.  HR ~60-80. Bulb suctioned pharynx then CPAP toPPV given with good response in HR and resp effort. Sao2 placed and sats in 90s.  Fio2 weaned on CPAP to RA then off.  Baby vitals stable.    Ap 4/8/9. Lungs clear to ausc with good perfusion and appropriate mental status in DR. Father updated.  To CN to care of Pediatrician.  Milena Liggett C. Dwight Adamczak, MD  

## 2018-05-19 NOTE — Progress Notes (Signed)
Labor Progress Note  Per HPI: Cristina PrinceSara Gillen is a 38 y.o. female G1 P0 at 39 wks and 4 days  presenting for induction of labor due to preeclampsia. Pt was seen in the office on 05/15/2018 at which time her bp was 140/88. PCR was 698. Short interval follow up was arranged for today and bp in the office today was 144/98. She denies headache visual disturbances or ruq pain. Pregnancy has been uncomplicated. Prenatal care provided by Dr. Gerald Leitzara Cole with [facility].   Subjective: Pt in bed and endorses really starting to feel contractions now, pt requesting epidural now, I was standing next to pt and heard her water break, large amount, clear, fetus tolerated well. Pt stable. Early decelerations noted  Patient Active Problem List   Diagnosis Date Noted  . Preeclampsia, third trimester 05/18/2018   Objective: BP 132/69   Pulse 74   Temp 98.8 F (37.1 C) (Oral)   Resp 18   Ht 5\' 3"  (1.6 m)   Wt 105.2 kg   BMI 41.10 kg/m  No intake/output data recorded. No intake/output data recorded. NST: FHR baseline 120 bpm, Variability: moderate, Accelerations:present, Decelerations:  Absent= Cat 1/Reactive CTX:  irregular, every 2-3 minutes, lasting 60-90 seconds Uterus gravid, soft non tender, moderate to palpate with contractions.   SROM, large amount, clear fluids.   SVE:  Dilation: 7.5 Effacement (%): 80 Station: 0 Exam by:: [facility] Dontavion Noxon, CNM Pitocin at ( Not started yet) mUn/min  Assessment:  Cristina Fisher, 38 y.o., G1P0, with an IUP @ 6061w4d, presented for IOL for preeclampsia without SF. Pt stable and resting in bed, in pain with cxt, requesting epidural now. Transitioned to 7cm/80/0. Early decels noted around 5 am resolved. Pt in labor with out pictocin.  Patient Active Problem List   Diagnosis Date Noted  . Preeclampsia, third trimester 05/18/2018   NICHD: Category 1  Membranes:  SROM clear @ 0615 on 10/21, no s/s of infection  Induction:    Cytotec x1 @ 1800 & 2200 on 1/20  Foley Bulb: not  yet  Pitocin - Not started   Pain management:               IV pain management: Px1 IV fentanyl @ 0508  Nitrous: PRN             Epidural placement:  Requested now.   GBS Negative   Preeclampsia without SF: BP 132/69, asymptomic  Plan: Continue labor plan Continuous/intermittent monitoring Rest Ambulate Frequent position changes to facilitate fetal rotation and descent. Will reassess with cervical exam as necessary Preeclampsia without SF: Monitor BP, labetalol protocol PRN, start magnesium if SF present with AH, or severe range BP >160/110, pt to received PP magnesium for 24 hours per Dr Richardson Doppole. Repeat labs in AM.  Anticipate labor progression and vaginal delivery.   Will report off th Dr Richardson Doppole at [id number]  Arelie Kuzel, NP-C, CNM, MSN 05/19/2018. 6:29 AM

## 2018-05-19 NOTE — Anesthesia Procedure Notes (Signed)
Date/Time: 05/19/2018 7:54 AM Performed by: Lewie Loron, MD Pre-anesthesia Checklist: Emergency Drugs available, Patient identified, Suction available and Patient being monitored Patient Re-evaluated:Patient Re-evaluated prior to induction Induction Type: IV induction and Cricoid Pressure applied Laryngoscope Size: 3 Grade View: Grade I Tube type: Oral Number of attempts: 1 Airway Equipment and Method: Stylet Placement Confirmation: ETT inserted through vocal cords under direct vision,  positive ETCO2 and breath sounds checked- equal and bilateral Secured at: 21 cm Tube secured with: Tape Dental Injury: Teeth and Oropharynx as per pre-operative assessment

## 2018-05-19 NOTE — Anesthesia Postprocedure Evaluation (Signed)
Anesthesia Post Note  Patient: Cristina Fisher  Procedure(s) Performed: CESAREAN SECTION (N/A )     Patient location during evaluation: [facility] Anesthesia Type: Epidural Level of consciousness: awake and alert Pain management: pain level controlled Vital Signs Assessment: post-procedure vital signs reviewed and stable Respiratory status: spontaneous breathing, nonlabored ventilation and respiratory function stable Cardiovascular status: stable Postop Assessment: no apparent nausea or vomiting and adequate PO intake Anesthetic complications: no    Last Vitals:  Vitals:   05/19/18 1300 05/19/18 1400  BP: (!) 145/80 (!) 149/89  Pulse: 93 97  Resp: 20 20  Temp: 37 C 36.8 C  SpO2: 97% 99%    Last Pain:  Vitals:   05/19/18 1400  TempSrc: Oral  PainSc: 2    Pain Goal: Patients Stated Pain Goal: 2 (05/19/18 1400)              Epidural/Spinal Function Cutaneous sensation: Normal sensation (05/19/18 1400), Patient able to flex knees: Yes (05/19/18 1400), Patient able to lift hips off bed: Yes (05/19/18 1400), Back pain beyond tenderness at insertion site: No (05/19/18 1400), Progressively worsening motor and/or sensory loss: No (05/19/18 1400), Bowel and/or bladder incontinence post epidural: No (05/19/18 1400)  Yonna Alwin Hristova

## 2018-05-19 NOTE — Op Note (Signed)
Cesarean Section Procedure Note  Indications: Fetal Bradycardia. Pt sent to OR stat by Dr. Vergie Living due to fetal bradycardia to the 80's without return to baseline . Upon my arrival pt in OR and fetal heart rate 90's. I assumed care of Cristina Fisher and Performed cesarean section   Pre-operative Diagnosis: 39 week 5 day pregnancy.  Post-operative Diagnosis: same  Surgeon: Gerald Leitz M.D.  Assistants: Dr. Emelda Fear   Anesthesia: Spinal anesthesia  ASA Class: 2   Procedure Details   The patient was seen in the Holding Room. The risks, benefits, complications, treatment options, and expected outcomes were discussed with the patient.  The patient concurred with the proposed plan, giving informed consent.  The site of surgery properly noted/marked. The patient was taken to Operating Room # 9, identified as Cristina Fisher and the procedure verified as C-Section Delivery. A Time Out was held and the above information confirmed.  After induction of anesthesia, the patient was draped and prepped in the usual sterile manner. A Pfannenstiel incision was made and carried down through the subcutaneous tissue to the fascia. Fascial incision was made and extended transversely. The fascia was separated from the underlying rectus tissue superiorly and inferiorly. The peritoneum was identified and entered. Peritoneal incision was extended longitudinally. The utero-vesical peritoneal reflection was incised transversely and the bladder flap was bluntly freed from the lower uterine segment. A low transverse uterine incision was made. Delivered from cephalic presentation was a  Female with Apgar scores of 3 at one minute and 7 at five minutes 9 at 10 minutes . After the umbilical cord was clamped and cut cord blood was obtained for evaluation. The placenta was removed  And a true knot was noted. . The uterine outline, tubes and ovaries appeared normal. The uterine incision was closed with running locked sutures of 0 vicryl.  A second layer of 0 vicryl was used to imbricate the incision.. Hemostasis was observed. Lavage was carried out until clear. INterseed was placed along the uterine incision.  The fascia was then reapproximated with running sutures of 0 pds. . The skin was reapproximated with 4-0 vicryl.  IInstrument, sponge, and needle counts were correct prior the abdominal closure and at the conclusion of the case.   Findings: Female infant in the cephalic presentation. True knot was noted in the umbilical cord. Normal fallopian tubes and ovaries   Estimated Blood Loss:  318 mL         Drains: None         Total IV Fluids:   Per anesthesia ml         Specimens: Placenta and Disposition:  Sent to Pathology          Implants: None         Complications:  None; patient tolerated the procedure well.         Disposition: PACU - hemodynamically stable.         Condition: stable  Attending Attestation: I performed the procedure.

## 2018-05-19 NOTE — Lactation Note (Signed)
This note was copied from a baby's chart. Lactation Consultation Note  Patient Name: Cristina Fisher Date: 05/19/2018 Reason for consult: Initial assessment;Primapara  Initial visit at 6 hours of life. Mom is a P1 who reports + breast changes w/pregnancy.   Mom's 3rd floor RN brought to my attention that mother's nipple is too large for infant's mouth. I agree with that assessment. Hand expression was taught to Mom, but a nil amount was yielded. Mom understands that "Cristina Fisher" may not be able to feed at the breast at this time, but it is possible that he will adequately latch once he has grown some.   Mom chose for infant to go ahead and receive a bottle, as she would like to take a nap before beginning to pump. Infant fed with ease using the  Similac slow-flow nipple (yellow).   Plan:  1. Mom to start pumping q3hrs, as able. Size 30 flanges provided.  2. Infant to be bottle-fed EBM/formula.   Lurline [facility]are [facility] 05/19/2018, 2:40 PM

## 2018-05-19 NOTE — Anesthesia Procedure Notes (Signed)
Epidural Patient location during procedure: OB Start time: 05/19/2018 7:15 AM End time: 05/19/2018 7:28 AM  Staffing Anesthesiologist: Lewie Loron, MD Performed: anesthesiologist   Preanesthetic Checklist Completed: patient identified, pre-op evaluation, timeout performed, IV checked, risks and benefits discussed and monitors and equipment checked  Epidural Patient position: sitting Prep: site prepped and draped and DuraPrep Patient monitoring: heart rate, continuous pulse ox and blood pressure Approach: midline Location: L2-L3 Injection technique: LOR air and LOR saline  Needle:  Needle type: Tuohy  Needle gauge: 17 G Needle length: 9 cm Needle insertion depth: 8 cm Catheter type: closed end flexible Catheter size: 19 Gauge Catheter at skin depth: 13 cm Test dose: negative  Assessment Sensory level: T8 Events: blood not aspirated, injection not painful, no injection resistance, negative IV test and no paresthesia  Additional Notes Reason for block:procedure for pain

## 2018-05-19 NOTE — Anesthesia Preprocedure Evaluation (Signed)
Anesthesia Evaluation  Patient identified by MRN, date of birth, ID band Patient awake    Reviewed: Allergy & Precautions, NPO status , Patient's Chart, lab work & pertinent test results  Airway Mallampati: III  TM Distance: >3 FB Neck ROM: Full    Dental  (+) Dental Advisory Given   Pulmonary neg pulmonary ROS,    Pulmonary exam normal breath sounds clear to auscultation       Cardiovascular hypertension, Normal cardiovascular exam Rhythm:Regular Rate:Normal     Neuro/Psych negative neurological ROS  negative psych ROS   GI/Hepatic negative GI ROS, Neg liver ROS,   Endo/Other  Morbid obesity  Renal/GU negative Renal ROS     Musculoskeletal negative musculoskeletal ROS (+)   Abdominal (+) + obese,   Peds  Hematology negative hematology ROS (+)   Anesthesia Other Findings   Reproductive/Obstetrics (+) Pregnancy                             Anesthesia Physical Anesthesia Plan  ASA: III  Anesthesia Plan: Epidural   Post-op Pain Management:    Induction:   PONV Risk Score and Plan:   Airway Management Planned:   Additional Equipment:   Intra-op Plan:   Post-operative Plan:   Informed Consent: I have reviewed the patients History and Physical, chart, labs and discussed the procedure including the risks, benefits and alternatives for the proposed anesthesia with the patient or authorized representative who has indicated his/her understanding and acceptance.       Plan Discussed with:   Anesthesia Plan Comments:         Anesthesia Quick Evaluation

## 2018-05-19 NOTE — Anesthesia Pain Management Evaluation Note (Signed)
  [facility] Pain Management Visit Note  Patient: Cristina Fisher, 38 y.o., female  "Hello I am a member of the anesthesia team at [facility]. We have an anesthesia team available at all times to provide care throughout the hospital, including epidural management and anesthesia for C-section. I don't know your plan for the delivery whether it a natural birth, water birth, IV sedation, nitrous supplementation, doula or epidural, but we want to meet your pain goals."   1.Was your pain managed to your expectations on prior hospitalizations?   No prior hospitalizations  2.What is your expectation for pain management during this hospitalization?     Epidural  3.How can we help you reach that goal? Epidural being placed  Record the patient's initial score and the patient's pain goal.   Pain: 8  Pain Goal: 5 [facility] wants you to be able to say your pain was always managed very well.  Chase Arnall 05/19/2018

## 2018-05-20 LAB — CBC
HCT: 31.4 % — ABNORMAL LOW (ref 36.0–46.0)
Hemoglobin: 10.4 g/dL — ABNORMAL LOW (ref 12.0–15.0)
MCH: 29.2 pg (ref 26.0–34.0)
MCHC: 33.1 g/dL (ref 30.0–36.0)
MCV: 88.2 fL (ref 80.0–100.0)
Platelets: 237 10*3/uL (ref 150–400)
RBC: 3.56 MIL/uL — ABNORMAL LOW (ref 3.87–5.11)
RDW: 16.4 % — ABNORMAL HIGH (ref 11.5–15.5)
WBC: 14.4 10*3/uL — ABNORMAL HIGH (ref 4.0–10.5)
nRBC: 0 % (ref 0.0–0.2)

## 2018-05-20 MED ORDER — NIFEDIPINE ER OSMOTIC RELEASE 30 MG PO TB24
30.0000 mg | ORAL_TABLET | Freq: Every day | ORAL | Status: DC
  Administered 2018-05-20 – 2018-05-22 (×3): 30 mg via ORAL
  Filled 2018-05-20 (×3): qty 1

## 2018-05-20 MED ORDER — NIFEDIPINE ER OSMOTIC RELEASE 30 MG PO TB24: 30.0000 mg | ORAL_TABLET | Freq: Every day | ORAL | Status: DC

## 2018-05-20 NOTE — Lactation Note (Signed)
This note was copied from a baby's chart. Lactation Consultation Note  Patient Name: Cristina Fisher HQION'GToday's Date: 05/20/2018 Reason for consult: Follow-up assessment;Term;Primapara;1st time breastfeeding;Other (Comment)(magnesium)  P1 mother whose infant is now 2426 hours old.  Mother's nipples are too large for baby to latch at this time.  Mother is pumping and will bottle feed until baby grows.    Encouraged mother to pump at least every 3 hours and to feed back any EBM she receives to baby.  It has been 4 hours now since she last pumped.  Suggested she be very diligent about remembering to pump regularly.  Mother verbalized understanding.  Encouraged hand expression before/after pumping to help increase milk supply.  Reviewed feeding guideline sheet with mother.  At times, baby has been hard to feed according to the parents.  They stated that he sleeps all the time and is hard to awaken.  Tips and techniques discussed on how to awaken him and keep him awaken during feedings.  Educated parents that he is now >24 hours and will probably wake up more during the day today.  Stressed the importance of calling her RN at the 1200 feeding if baby does not consume the minimum amount needed.  Parents verbalized understanding.  Discussed obtaining a DEBP from her insurance company and father will call today for shipment of the pump.  Instructed to call for any further questions/concerns after discharge.   Maternal Data Formula Feeding for Exclusion: No Has patient been taught Hand Expression?: Yes Does the patient have breastfeeding experience prior to this delivery?: No  Feeding Feeding Type: Bottle Fed - Formula Nipple Type: Slow - flow  LATCH Score                   Interventions    Lactation Tools Discussed/Used WIC Program: No Initiated by:: Already initiated   Consult Status Consult Status: Follow-up Date: 05/21/18 Follow-up type: In-patient    Cristina Fisher  Cristina Fisher 05/20/2018, 10:04 AM

## 2018-05-20 NOTE — Progress Notes (Signed)
Inpatient Diabetes Program Recommendations  AACE/ADA: New Consensus Statement on Inpatient Glycemic Control (2015)  Target Ranges:  Prepandial:   less than 140 mg/dL      Peak postprandial:   less than 180 mg/dL (1-2 hours)      Critically ill patients:  140 - 180 mg/dL    Review of Glycemic Control Results for Cristina Fisher, Cristina Fisher (MRN [id number]) as of 05/20/2018 08:43  Ref. Range 05/19/2018 16:04  Glucose Latest Ref Range: 70 - 99 mg/dL 564 (H)      Outpatient Diabetes medications: none Current orders for Inpatient glycemic control: none  Inpatient Diabetes Program Recommendations:    Noted Decadron 5 mg x1, thus could explain increase to glucose trends.  However, given post partum serum glucose, consider adding Novolog 0-9 units & Novolog 0-5 units QHS along with subsequent CBGs.  Will continue to follow.  Thanks, Lujean Rave, MSN, RNC-OB Diabetes Coordinator [phone] (8a-5p)

## 2018-05-20 NOTE — Progress Notes (Signed)
Subjective: Postpartum Day 1: Cesarean Delivery Patient reports tolerating PO.  She denies headache visual disturbances or ruq pain.Marland Kitchen incisional pain is well controlled.  Objective: Vital signs in last 24 hours: Temp:  [97.6 F (36.4 C)-98.7 F (37.1 C)] 98.2 F (36.8 C) (01/22 1145) Pulse Rate:  [85-103] 103 (01/22 1145) Resp:  [16-20] 17 (01/22 1145) BP: (117-159)/(72-100) 125/79 (01/22 1145) SpO2:  [95 %-100 %] 98 % (01/22 1145)  Physical Exam:  General: alert, cooperative and no distress Lochia: appropriate Uterine Fundus: firm Incision: bandage clean dry and intact  DVT Evaluation: No evidence of DVT seen on physical exam.  Recent Labs    05/19/18 1604 05/20/18 0454  HGB 12.4 10.4*  HCT 38.0 31.4*    Assessment/Plan: Status post Cesarean section. Doing well postoperatively.  Preeclampsia- discontinue Magnesium sulfate .. will monitor bp if elevates plan on starting procardia XL 30 mg  Encourage ambulation  Pt may shower she is advised to remove outer dressing in the shower.. honeycomb dressing should remain in place  Saline lock IV .  Gerald Leitz 05/20/2018, 1:28 PM

## 2018-05-21 ENCOUNTER — Encounter (HOSPITAL_COMMUNITY): Payer: Self-pay | Admitting: Obstetrics and Gynecology

## 2018-05-21 MED ORDER — OXYCODONE HCL 5 MG PO TABS: 5.0000 mg | ORAL_TABLET | ORAL | 0 refills | Status: AC | PRN

## 2018-05-21 MED ORDER — IBUPROFEN 800 MG PO TABS: 800.0000 mg | ORAL_TABLET | Freq: Three times a day (TID) | ORAL | 1 refills | Status: AC | PRN

## 2018-05-21 MED ORDER — NIFEDIPINE ER 30 MG PO TB24: 30.0000 mg | ORAL_TABLET | Freq: Every day | ORAL | 1 refills | Status: AC

## 2018-05-21 NOTE — Progress Notes (Signed)
Subjective: Postpartum Day 2: Cesarean Delivery Patient reports incisional pain, tolerating PO, + flatus and no problems voiding.    Objective: Vital signs in last 24 hours: Temp:  [97.8 F (36.6 C)-99.1 F (37.3 C)] 98.8 F (37.1 C) (01/23 1646) Pulse Rate:  [90-110] 110 (01/23 1646) Resp:  [18-20] 20 (01/23 1646) BP: (123-153)/(74-88) 126/74 (01/23 1646) SpO2:  [99 %-100 %] 99 % (01/23 1646)  Physical Exam:  General: alert, cooperative and no distress Lochia: appropriate Uterine Fundus: firm Incision: bandage clean dry and intact  DVT Evaluation: No evidence of DVT seen on physical exam.  Recent Labs    05/19/18 1604 05/20/18 0454  HGB 12.4 10.4*  HCT 38.0 31.4*    Assessment/Plan: Status post Cesarean section. Doing well postoperatively.  Preeclampsia.Marland Kitchen continue procardia XL 30 mg daily bp well controlled.  D/c home tomorrow  Follow up in office in 1 week for bp check and in 2 weeks for incision check .  Gerald Leitz 05/21/2018, 5:57 PM

## 2018-05-21 NOTE — Lactation Note (Signed)
This note was copied from a baby's chart. Lactation Consultation Note  Patient Name: Cristina Fisher ZOXWR'UToday's Date: 05/21/2018 Reason for consult: Follow-up assessment Mom reports she is using DEBP every 3 hours/trying for 8 times day.  Baby Cristina now 52 hours and mom has not seen any milk.  Discussed increasing pumping to 10-12 times day and trying to get them in by pumping more often during the day.  Parents got their DEBP ordered for home use.  Parents report they chose a Spectra DEBP.  Urged parents to make sure they ordered the larger flanges to go with the breastpump.  Mom reports doing breast massage but not actually trying to remove any milk with her hands.  Asked mom if we could try some hand expression.  Unable to hand express and remove anything on left breast.  Moves to right breast.  Mom with notable areolar swelling and tissue hard and not compressable. However, able to remove some drops of colostrum from right breast,.  Mom reprots that she is happy and wants to keep trying with the hand expression.  Urged mom to massage areolar tissue and nipple also when massaging the breasts prior to pumping. Showed parents [employer] Hand Expression Hands on Pumping video. Mom reports her goal is to breastfeed. Urged her to get in good milk production/supply so that when baby is ready to go there he will have plenty of milk.  Discussed continuing STS even though baby not latching right now.  Urged STS prior to pumping.  Urged heat right before and with pumping.Urged mom to look at baby when she is pumping.  Praised moms efforts.   Urged her to contact lactation as needed.  Maternal Data Has patient been taught Hand Expression?: Yes  Feeding Feeding Type: Breast Milk Nipple Type: Slow - flow  LATCH Score                   Interventions    Lactation Tools Discussed/Used     Consult Status Consult Status: Follow-up Date: 05/22/18 Follow-up type: In-patient    [facility] Michaelle CopasS  Layah Skousen 05/21/2018, 12:08 PM

## 2018-05-21 NOTE — Progress Notes (Signed)
CSW received consult due to score 10 on Edinburgh Depression Screen.    CSW met with MOB in room 309 to complete an assessment for 10 on Edinburgh.  When CSW arrived, MOB was bonding with infant as evidence by engaging in skin to skin. CSW explained CSW's role and MOB gave CSW permission to complete assessment while FOB present.  FOB appeared supportive and communicated a desire to continue to support MOB and infant postpartum. MOB was polite, easy to engage, and receptive to meeting with CSW. MOB expressed feeling "useless" due to recovery process with c-section.    CSW provided education regarding Baby Blues vs PMADs and provided MOB with resources for mental health follow up.  CSW encouraged MOB to evaluate her mental health throughout the postpartum period with the use of the New Mom Checklist developed by Postpartum [facility] as well as the [place] Postnatal Depression Scale and notify a medical professional if symptoms arise.  CSW assessed for safety and MOB denied, SI and HI. MOB reported having all essential items and feeling prepared to parent.   There are on barriers to discharge.  Laurey Arrow, MSW, LCSW Clinical Social Work [phone]

## 2018-05-22 ENCOUNTER — Encounter (HOSPITAL_COMMUNITY): Payer: Self-pay | Admitting: *Deleted

## 2018-05-22 NOTE — Progress Notes (Signed)
Discharge instructions reviewed with patient using the Mother and Baby Care Book.  Parents were receptive to RN and are read for discharge.

## 2018-05-22 NOTE — Discharge Summary (Signed)
OB Discharge Summary     Patient Name: Cristina Fisher DOB: [birth year] MRN: [id number]  Date of admission: 05/18/2018 Delivering MD: Gerald LeitzOLE, TARA   Date of discharge: 05/22/2018  Admitting diagnosis: 39wks Induction Intrauterine pregnancy: 10438w5d     Secondary diagnosis:  Active Problems:   Preeclampsia, third trimester  Additional problems: None     Discharge diagnosis: Term Pregnancy Delivered and Preeclampsia (mild)                                                                                                Post partum procedures:Magnesium x 24 hours, Started Procardia XL 30 mg  Augmentation: Cytotec  Complications: None  Hospital course:  Induction of Labor With Cesarean Section  38 y.o. yo G1P1001 at 4238w5d was admitted to the hospital 05/18/2018 for induction of labor. Patient had a labor course significant for fetal distress. The patient went for stat cesarean section due to Fetal bradycardia, and delivered a Viable infant,05/19/2018  Membrane Rupture Time/Date: 6:10 AM ,05/19/2018   Details of operation can be found in separate operative Note.  Patient had an uncomplicated postpartum course. She is ambulating, tolerating a regular diet, passing flatus, and urinating well.  Patient is discharged home in stable condition on 05/23/18.                                    Physical exam  Vitals:   05/22/18 0009 05/22/18 0412 05/22/18 0918 05/22/18 1356  BP: (!) 145/85 (!) 144/92 (!) 147/83 (!) 142/91  Pulse: (!) 101 88 (!) 101 97  Resp:  17 18   Temp:  97.8 F (36.6 C) 98.2 F (36.8 C)   TempSrc:  Oral Oral   SpO2:  98% 99%   Weight:      Height:       General: alert, cooperative and no distress Lochia: appropriate Uterine Fundus: firm Incision: Dressing is clean, dry, and intact DVT Evaluation: No evidence of DVT seen on physical exam. Calf/Ankle edema is present Labs: Lab Results  Component Value Date   WBC 14.4 (H) 05/20/2018   HGB 10.4 (L) 05/20/2018   HCT 31.4 (L)  05/20/2018   MCV 88.2 05/20/2018   PLT 237 05/20/2018   CMP Latest Ref Rng & Units 05/19/2018  Glucose 70 - 99 mg/dL 244(W200(H)  BUN 6 - 20 mg/dL 10  Creatinine 1.020.44 - 7.251.00 mg/dL 3.660.75  Sodium 440135 - 347145 mmol/L 133(L)  Potassium 3.5 - 5.1 mmol/L 4.0  Chloride 98 - 111 mmol/L 103  CO2 22 - 32 mmol/L 21(L)  Calcium 8.9 - 10.3 mg/dL 7.9(L)  Total Protein 6.5 - 8.1 g/dL 4.2(V5.6(L)  Total Bilirubin 0.3 - 1.2 mg/dL 0.5  Alkaline Phos 38 - 126 U/L 80  AST 15 - 41 U/L 32  ALT 0 - 44 U/L 18    Discharge instruction: per After Visit Summary and "Baby and Me Booklet".  After visit meds:  Allergies as of 05/22/2018   No Known Allergies     Medication List  STOP taking these medications   aspirin EC 81 MG tablet     TAKE these medications   ibuprofen 800 MG tablet Commonly known as:  ADVIL,MOTRIN Take 1 tablet (800 mg total) by mouth every 8 (eight) hours as needed.   NIFEdipine 30 MG 24 hr tablet Commonly known as:  ADALAT CC Take 1 tablet (30 mg total) by mouth daily.   oxyCODONE 5 MG immediate release tablet Commonly known as:  Oxy IR/ROXICODONE Take 1-2 tablets (5-10 mg total) by mouth every 4 (four) hours as needed for moderate pain.   prenatal multivitamin Tabs tablet Take 1 tablet by mouth daily at 12 noon.       Diet: routine diet  Activity: Advance as tolerated. Pelvic rest for 6 weeks.   Outpatient follow up:< 1 week for BP check Follow up Appt:No future appointments. Follow up Visit:No follow-ups on file.  Postpartum contraception: Discussed  Newborn Data: Live born female  Birth Weight: 6 lb 1.4 oz (2760 g) APGAR: 4, 8  Newborn Delivery   Birth date/time:  05/19/2018 07:58:00 Delivery type:  C-Section, Low Transverse Trial of labor:  Yes C-section categorization:  Primary     Baby Feeding: Breast Disposition:home with mother   05/22/2018 Geryl Rankins, MD

## 2018-05-22 NOTE — Lactation Note (Signed)
This note was copied from a baby's chart. Lactation Consultation Note: Dad asked me to see mom as baby was skin to skin and rooting. Assisted mom with positioning baby at the breast in football hold. Baby on and off the breast as mom has large nipples. On and off the breast for 5 min then going off to sleep. Parents encouraged that baby was trying to breast feed. Mom reports breasts are feeling heavier this morning. Pumped for 15 min and obtained 35 ml transitional milk. Encouraged to try to breast feed then give EBM and formula if needed and to pump after feedings. Dad bottle fed formula as baby woke up and was fussy when dad laid him in bassinet. Dad plans to drive to [place] today to get pump from insurance,. No questions at present. Reviewed our phone number, OP appointments and BFS[facility] as resources for support after DC . Encouraged to make OP appointment for assist with latch when baby grows and able to latch better.   Patient Name: Cristina Fisher EXNTZ'G Date: 05/22/2018 Reason for consult: Follow-up assessment;1st time breastfeeding   Maternal Data Formula Feeding for Exclusion: No Has patient been taught Hand Expression?: Yes Does the patient have breastfeeding experience prior to this delivery?: No  Feeding Feeding Type: Bottle Fed - Formula Nipple Type: Slow - flow  LATCH Score Latch: Repeated attempts needed to sustain latch, nipple held in mouth throughout feeding, stimulation needed to elicit sucking reflex.  Audible Swallowing: None  Type of Nipple: Everted at rest and after stimulation  Comfort (Breast/Nipple): Soft / non-tender  Hold (Positioning): Assistance needed to correctly position infant at breast and maintain latch.  LATCH Score: 6  Interventions Interventions: Breast feeding basics reviewed;DEBP;Expressed milk;Hand express;Breast compression  Lactation Tools Discussed/Used WIC Program: No Pump Review: Setup, frequency, and cleaning   Consult  Status Consult Status: Complete    Pamelia Hoit 05/22/2018, 9:01 AM

## 2018-05-22 NOTE — Discharge Instructions (Signed)
Cesarean Delivery, Care After °This sheet gives you information about how to care for yourself after your procedure. Your health care provider may also give you more specific instructions. If you have problems or questions, contact your health care provider. °What can I expect after the procedure? °After the procedure, it is common to have: °· A small amount of blood or clear fluid coming from the incision. °· Some redness, swelling, and pain in your incision area. °· Some abdominal pain and soreness. °· Vaginal bleeding (lochia). Even though you did not have a vaginal delivery, you will still have vaginal bleeding and discharge. °· Pelvic cramps. °· Fatigue. °You may have pain, swelling, and discomfort in the tissue between your vagina and your anus (perineum) if: °· Your C-section was unplanned, and you were allowed to labor and push. °· An incision was made in the area (episiotomy) or the tissue tore during attempted vaginal delivery. °Follow these instructions at home: °Incision care ° °· Follow instructions from your health care provider about how to take care of your incision. Make sure you: °? Wash your hands with soap and water before you change your bandage (dressing). If soap and water are not available, use hand sanitizer. °? If you have a dressing, change it or remove it as told by your health care provider. °? Leave stitches (sutures), skin staples, skin glue, or adhesive strips in place. These skin closures may need to stay in place for 2 weeks or longer. If adhesive strip edges start to loosen and curl up, you may trim the loose edges. Do not remove adhesive strips completely unless your health care provider tells you to do that. °· Check your incision area every day for signs of infection. Check for: °? More redness, swelling, or pain. °? More fluid or blood. °? Warmth. °? Pus or a bad smell. °· Do not take baths, swim, or use a hot tub until your health care provider says it's okay. Ask your health  care provider if you can take showers. °· When you cough or sneeze, hug a pillow. This helps with pain and decreases the chance of your incision opening up (dehiscing). Do this until your incision heals. °Medicines °· Take over-the-counter and prescription medicines only as told by your health care provider. °· If you were prescribed an antibiotic medicine, take it as told by your health care provider. Do not stop taking the antibiotic even if you start to feel better. °· Do not drive or use heavy machinery while taking prescription pain medicine. °Lifestyle °· Do not drink alcohol. This is especially important if you are breastfeeding or taking pain medicine. °· Do not use any products that contain nicotine or tobacco, such as cigarettes, e-cigarettes, and chewing tobacco. If you need help quitting, ask your health care provider. °Eating and drinking °· Drink at least 8 eight-ounce glasses of water every day unless told not to by your health care provider. If you breastfeed, you may need to drink even more water. °· Eat high-fiber foods every day. These foods may help prevent or relieve constipation. High-fiber foods include: °? Whole grain cereals and breads. °? Brown rice. °? Beans. °? Fresh fruits and vegetables. °Activity ° °· If possible, have someone help you care for your baby and help with household activities for at least a few days after you leave the hospital. °· Return to your normal activities as told by your health care provider. Ask your health care provider what activities are safe for   you. °· Rest as much as possible. Try to rest or take a nap while your baby is sleeping. °· Do not lift anything that is heavier than 10 lbs (4.5 kg), or the limit that you were told, until your health care provider says that it is safe. °· Talk with your health care provider about when you can engage in sexual activity. This may depend on your: °? Risk of infection. °? How fast you heal. °? Comfort and desire to  engage in sexual activity. °General instructions °· Do not use tampons or douches until your health care provider approves. °· Wear loose, comfortable clothing and a supportive and well-fitting bra. °· Keep your perineum clean and dry. Wipe from front to back when you use the toilet. °· If you pass a blood clot, save it and call your health care provider to discuss. Do not flush blood clots down the toilet before you get instructions from your health care provider. °· Keep all follow-up visits for you and your baby as told by your health care provider. This is important. °Contact a health care provider if: °· You have: °? A fever. °? Bad-smelling vaginal discharge. °? Pus or a bad smell coming from your incision. °? Difficulty or pain when urinating. °? A sudden increase or decrease in the frequency of your bowel movements. °? More redness, swelling, or pain around your incision. °? More fluid or blood coming from your incision. °? A rash. °? Nausea. °? Little or no interest in activities you used to enjoy. °? Questions about caring for yourself or your baby. °· Your incision feels warm to the touch. °· Your breasts turn red or become painful or hard. °· You feel unusually sad or worried. °· You vomit. °· You pass a blood clot from your vagina. °· You urinate more than usual. °· You are dizzy or light-headed. °Get help right away if: °· You have: °? Pain that does not go away or get better with medicine. °? Chest pain. °? Difficulty breathing. °? Blurred vision or spots in your vision. °? Thoughts about hurting yourself or your baby. °? New pain in your abdomen or in one of your legs. °? A severe headache. °· You faint. °· You bleed from your vagina so much that you fill more than one sanitary pad in one hour. Bleeding should not be heavier than your heaviest period. °Summary °· After the procedure, it is common to have pain at your incision site, abdominal cramping, and slight bleeding from your vagina. °· Check  your incision area every day for signs of infection. °· Tell your health care provider about any unusual symptoms. °· Keep all follow-up visits for you and your baby as told by your health care provider. °This information is not intended to replace advice given to you by your health care provider. Make sure you discuss any questions you have with your health care provider. °Document Released: 01/05/2002 Document Revised: 10/22/2017 Document Reviewed: 10/22/2017 °Elsevier Interactive Patient Education © 2019 [employer] ° °Preeclampsia and Eclampsia ° °Preeclampsia is a serious condition that may develop during pregnancy. It is also called toxemia of pregnancy. This condition causes high blood pressure along with other symptoms, such as swelling and headaches. These symptoms may develop as the condition gets worse. Preeclampsia may occur at 20 weeks of pregnancy or later. °Diagnosing and treating preeclampsia early is very important. If not treated early, it can cause serious problems for you and your baby. One problem it   can lead to is eclampsia. Eclampsia is a condition that causes muscle jerking or shaking (convulsions or seizures) and other serious problems for the mother. During pregnancy, delivering your baby may be the best treatment for preeclampsia or eclampsia. For most women, preeclampsia and eclampsia symptoms go away after giving birth. °In rare cases, a woman may develop preeclampsia after giving birth (postpartum preeclampsia). This usually occurs within 48 hours after childbirth but may occur up to 6 weeks after giving birth. °What are the causes? °The cause of preeclampsia is not known. °What increases the risk? °The following risk factors make you more likely to develop preeclampsia: °· Being pregnant for the first time. °· Having had preeclampsia during a past pregnancy. °· Having a family history of preeclampsia. °· Having high blood pressure. °· Being pregnant with more than one baby. °· Being  35 or older. °· Being African-American. °· Having kidney disease or diabetes. °· Having medical conditions such as lupus or blood diseases. °· Being very overweight (obese). °What are the signs or symptoms? °The earliest signs of preeclampsia are: °· High blood pressure. °· Increased protein in your urine. Your health care provider will check for this at every visit before you give birth (prenatal visit). °Other symptoms that may develop as the condition gets worse include: °· Severe headaches. °· Sudden weight gain. °· Swelling of the hands, face, legs, and feet. °· Nausea and vomiting. °· Vision problems, such as blurred or double vision. °· Numbness in the face, arms, legs, and feet. °· Urinating less than usual. °· Dizziness. °· Slurred speech. °· Abdominal pain, especially upper abdominal pain. °· Convulsions or seizures. °How is this diagnosed? °There are no screening tests for preeclampsia. Your health care provider will ask you about symptoms and check for signs of preeclampsia during your prenatal visits. You may also have tests that include: °· Urine tests. °· Blood tests. °· Checking your blood pressure. °· Monitoring your baby’s heart rate. °· Ultrasound. °How is this treated? °You and your health care provider will determine the treatment approach that is best for you. Treatment may include: °· Having more frequent prenatal exams to check for signs of preeclampsia, if you have an increased risk for preeclampsia. °· Medicine to lower your blood pressure. °· Staying in the hospital, if your condition is severe. There, treatment will focus on controlling your blood pressure and the amount of fluids in your body (fluid retention). °· Taking medicine (magnesium sulfate) to prevent seizures. This may be given as an injection or through an IV. °· Taking a low-dose aspirin during your pregnancy. °· Delivering your baby early, if your condition gets worse. You may have your labor started with medicine (induced),  or you may have a cesarean delivery. °Follow these instructions at home: °Eating and drinking ° °· Drink enough fluid to keep your urine pale yellow. °· Avoid caffeine. °Lifestyle °· Do not use any products that contain nicotine or tobacco, such as cigarettes and e-cigarettes. If you need help quitting, ask your health care provider. °· Do not use alcohol or drugs. °· Avoid stress as much as possible. Rest and get plenty of sleep. °General instructions °· Take over-the-counter and prescription medicines only as told by your health care provider. °· When lying down, lie on your left side. This keeps pressure off your major blood vessels. °· When sitting or lying down, raise (elevate) your feet. Try putting some pillows underneath your lower legs. °· Exercise regularly. Ask your health care provider what   kinds of exercise are best for you. °· Keep all follow-up and prenatal visits as told by your health care provider. This is important. °How is this prevented? °There is no known way of preventing preeclampsia or eclampsia from developing. However, to lower your risk of complications and detect problems early: °· Get regular prenatal care. Your health care provider may be able to diagnose and treat the condition early. °· Maintain a healthy weight. Ask your health care provider for help managing weight gain during pregnancy. °· Work with your health care provider to manage any long-term (chronic) health conditions you have, such as diabetes or kidney problems. °· You may have tests of your blood pressure and kidney function after giving birth. °· Your health care provider may have you take low-dose aspirin during your next pregnancy. °Contact a health care provider if: °· You have symptoms that your health care provider told you may require more treatment or monitoring, such as: °? Headaches. °? Nausea or vomiting. °? Abdominal pain. °? Dizziness. °? Light-headedness. °Get help right away if: °· You have  severe: °? Abdominal pain. °? Headaches that do not get better. °? Dizziness. °? Vision problems. °? Confusion. °? Nausea or vomiting. °· You have any of the following: °? A seizure. °? Sudden, rapid weight gain. °? Sudden swelling in your hands, ankles, or face. °? Trouble moving any part of your body. °? Numbness in any part of your body. °? Trouble speaking. °? Abnormal bleeding. °· You faint. °Summary °· Preeclampsia is a serious condition that may develop during pregnancy. It is also called toxemia of pregnancy. °· This condition causes high blood pressure along with other symptoms, such as swelling and headaches. °· Diagnosing and treating preeclampsia early is very important. If not treated early, it can cause serious problems for you and your baby. °· Get help right away if you have symptoms that your health care provider told you to watch for. °This information is not intended to replace advice given to you by your health care provider. Make sure you discuss any questions you have with your health care provider. °Document Released: 04/12/2000 Document Revised: 04/01/2017 Document Reviewed: 11/20/2015 °Elsevier Interactive Patient Education © 2019 [employer] ° °

## 2018-05-25 ENCOUNTER — Encounter (HOSPITAL_COMMUNITY): Payer: Self-pay | Admitting: Obstetrics and Gynecology

## 2018-05-25 NOTE — Addendum Note (Signed)
Addendum  created 05/25/18 1745 by Richelle Glick, Doree Fudge, CRNA   Intraprocedure Event edited, Intraprocedure Meds edited

## 2018-05-28 ENCOUNTER — Encounter (HOSPITAL_COMMUNITY): Payer: Self-pay | Admitting: Obstetrics and Gynecology

## 2018-05-28 NOTE — Addendum Note (Signed)
Addendum  created 05/28/18 2125 by Lewie Loron, MD   Intraprocedure Event edited

## 2019-05-05 ENCOUNTER — Other Ambulatory Visit: Payer: Self-pay | Admitting: Family Medicine

## 2019-05-05 DIAGNOSIS — R109 Unspecified abdominal pain: Secondary | ICD-10-CM

## 2019-05-06 ENCOUNTER — Ambulatory Visit
Admission: RE | Admit: 2019-05-06 | Discharge: 2019-05-06 | Disposition: A | Discharge status: Home / Self Care | Payer: BC Managed Care – PPO | Source: Ambulatory Visit | Attending: Family Medicine | Admitting: Family Medicine

## 2019-05-06 DIAGNOSIS — R109 Unspecified abdominal pain: Secondary | ICD-10-CM

## 2019-06-21 ENCOUNTER — Ambulatory Visit: Payer: BC Managed Care – PPO | Attending: Family

## 2019-06-21 DIAGNOSIS — Z23 Encounter for immunization: Secondary | ICD-10-CM | POA: Insufficient documentation

## 2019-06-21 NOTE — Progress Notes (Signed)
   Covid-19 Vaccination Clinic  Name:  Shandelle Borrelli    MRN: [id number] DOB: 1981/01/26  06/21/2019  Ms. Youkhana was observed post Covid-19 immunization for 15 minutes without incidence. She was provided with Vaccine Information Sheet and instruction to access the V-Safe system.   Ms. Sergent was instructed to call 911 with any severe reactions post vaccine: Marland Kitchen Difficulty breathing  . Swelling of your face and throat  . A fast heartbeat  . A bad rash all over your body  . Dizziness and weakness    Immunizations Administered    Name Date Dose VIS Date Route   Moderna COVID-19 Vaccine 06/21/2019  5:23 PM 0.5 mL 03/30/2019 Intramuscular   Manufacturer: Moderna   Lot: [id number]   NDC: 65465-035-46

## 2019-07-27 ENCOUNTER — Ambulatory Visit: Payer: BC Managed Care – PPO | Attending: Family

## 2019-07-27 DIAGNOSIS — Z23 Encounter for immunization: Secondary | ICD-10-CM

## 2019-07-27 NOTE — Progress Notes (Signed)
   Covid-19 Vaccination Clinic  Name:  Ivianna Notch    MRN: [id number] DOB: 1980/07/17  07/27/2019  Ms. Boal was observed post Covid-19 immunization for 15 minutes without incident. She was provided with Vaccine Information Sheet and instruction to access the V-Safe system.   Ms. Halfhill was instructed to call 911 with any severe reactions post vaccine: Marland Kitchen Difficulty breathing  . Swelling of face and throat  . A fast heartbeat  . A bad rash all over body  . Dizziness and weakness   Immunizations Administered    Name Date Dose VIS Date Route   Moderna COVID-19 Vaccine 07/27/2019 10:11 AM 0.5 mL 03/30/2019 Intramuscular   Manufacturer: Moderna   Lot: [id number]   NDC: 76151-834-37

## 2019-08-03 ENCOUNTER — Ambulatory Visit: Payer: BC Managed Care – PPO

## 2021-12-12 IMAGING — US US ABDOMEN LIMITED
1 series · 14 of 25 positions shown · non-contrast
Comparison: No prior.

CLINICAL DATA: Abdominal pain.

EXAM:
ULTRASOUND ABDOMEN LIMITED RIGHT UPPER QUADRANT

[Series 1: us abdomen limited · 0.28mm/px · 14 of 36 slices shown]
[im 1/36]
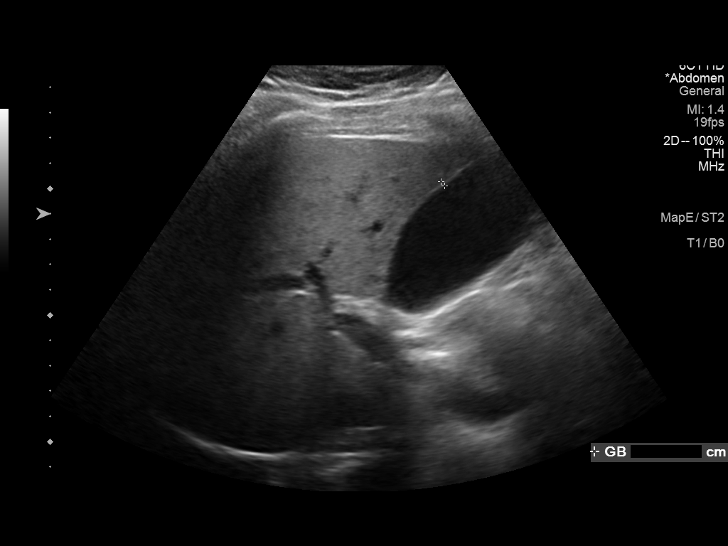
[im 3/36]
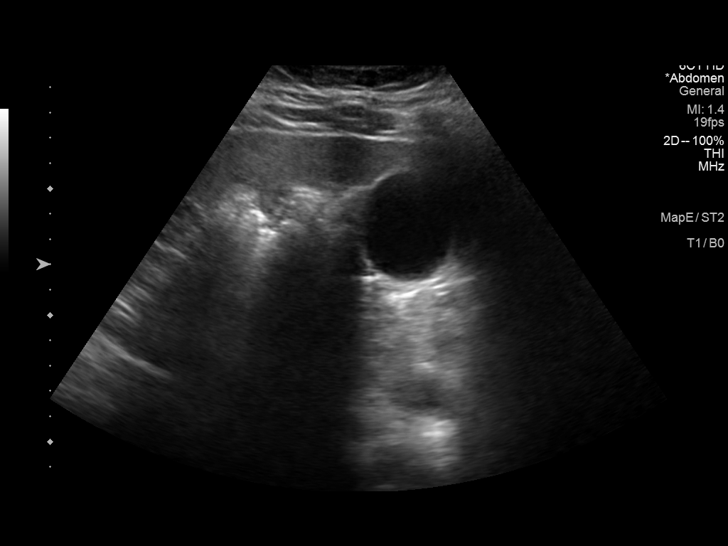
[im 6/36]
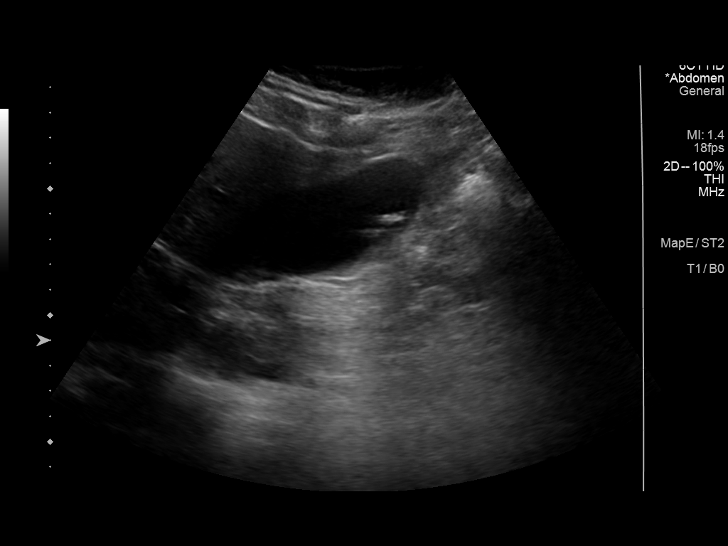
[im 9/36]
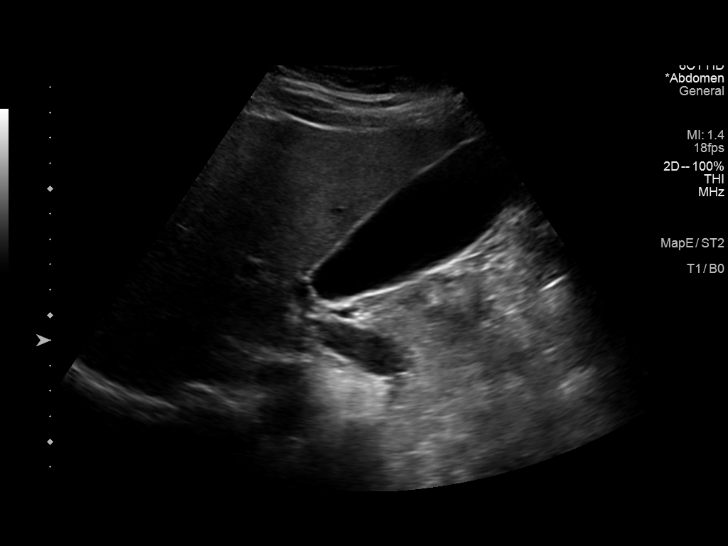
[im 12/36]
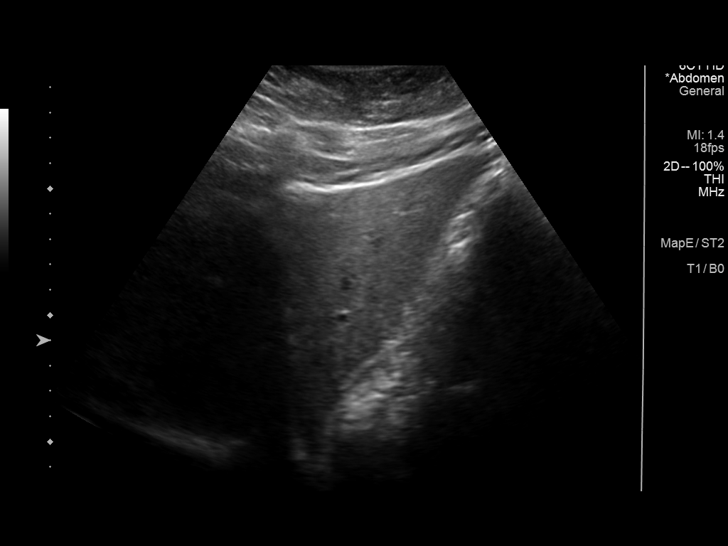
[im 14/36]
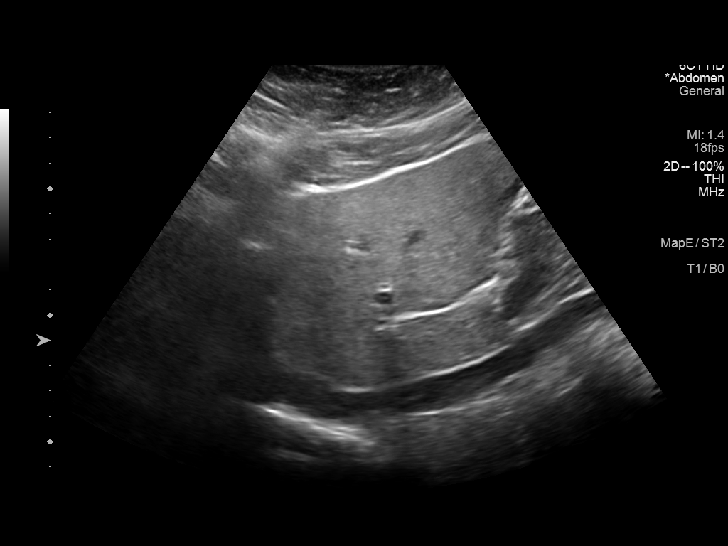
[im 17/36]
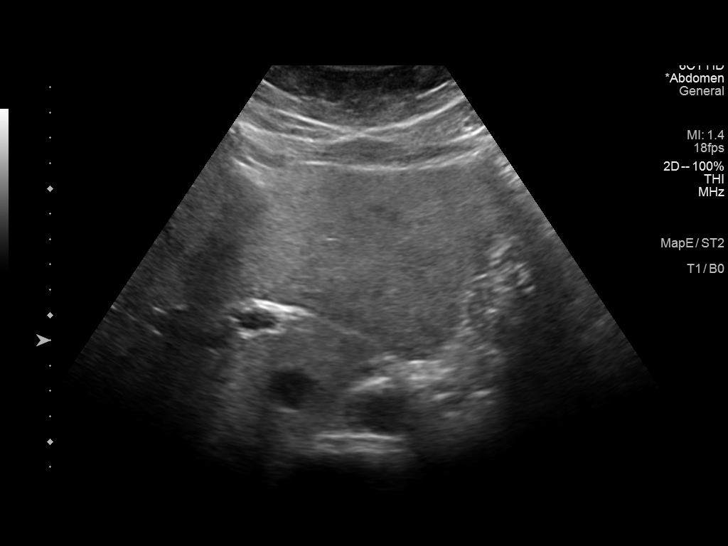
[im 19/36]
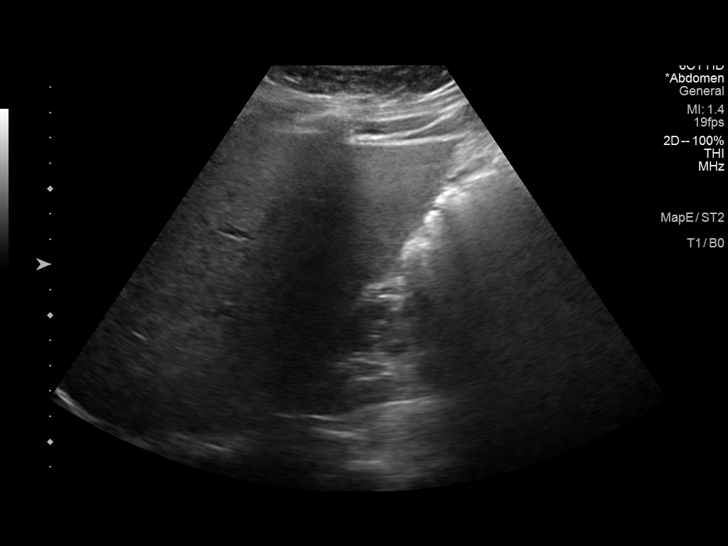
[im 22/36]
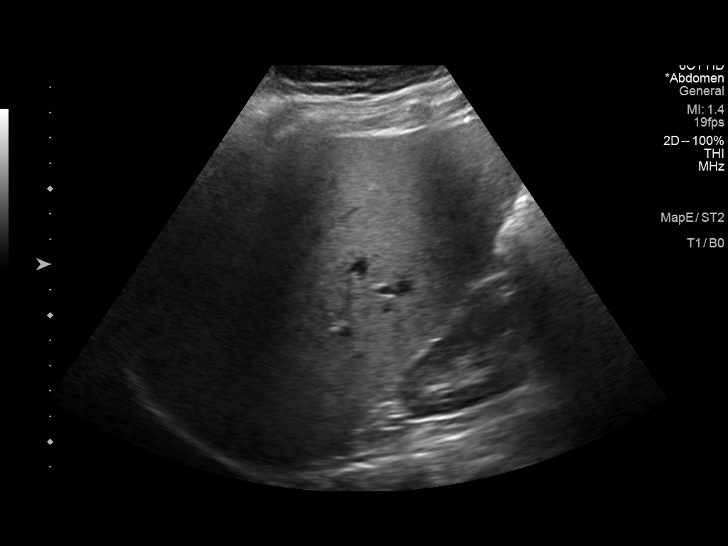
[im 24/36]
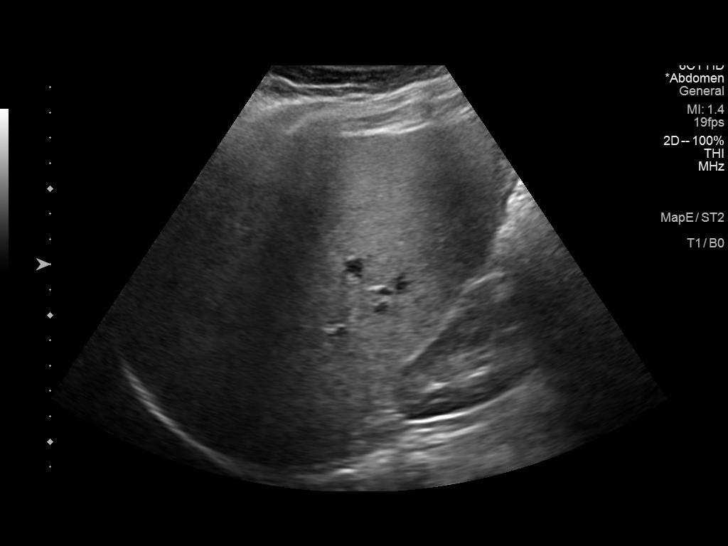
[im 27/36]
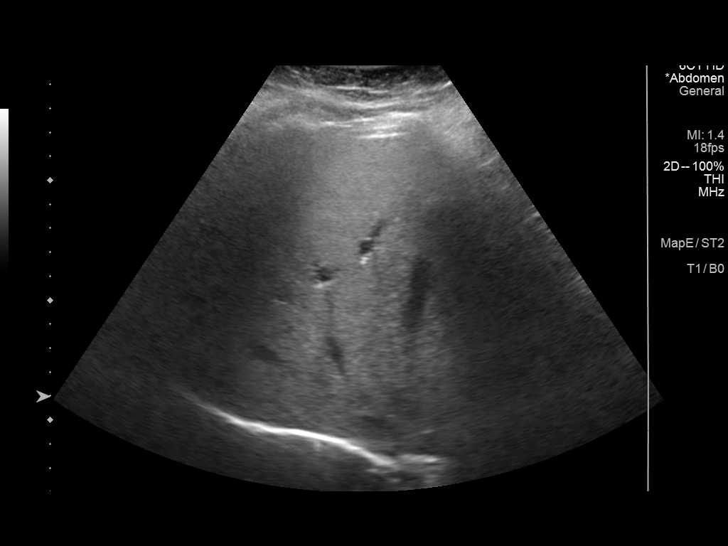
[im 30/36]
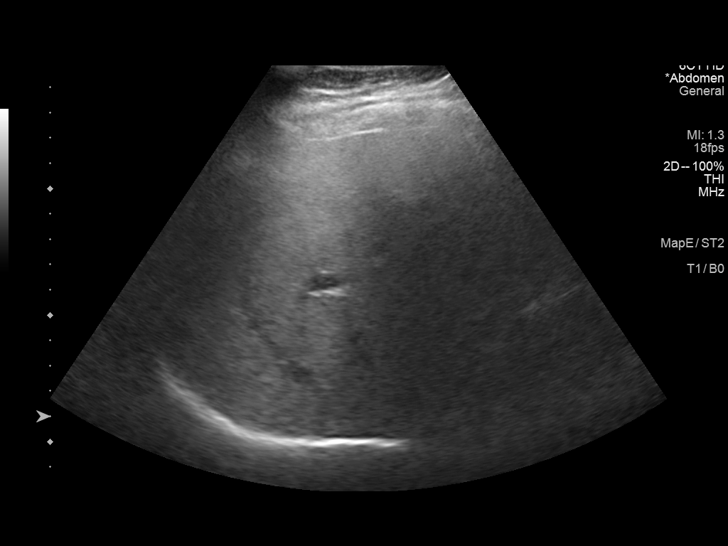
[im 33/36]
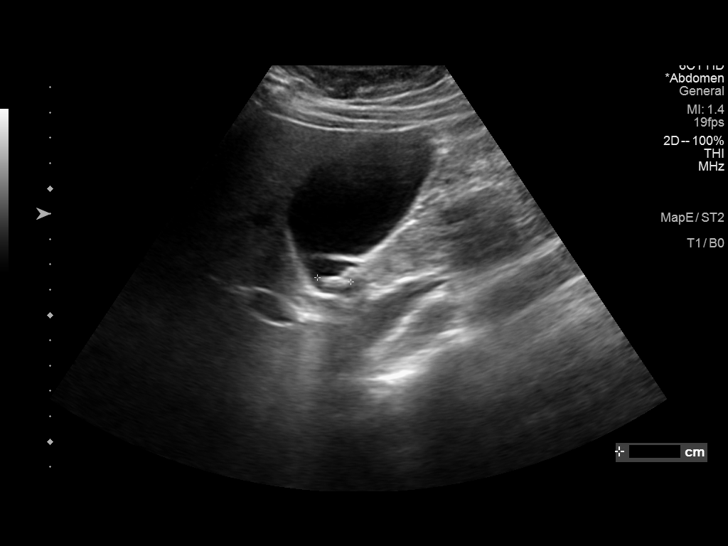
[im 36/36]
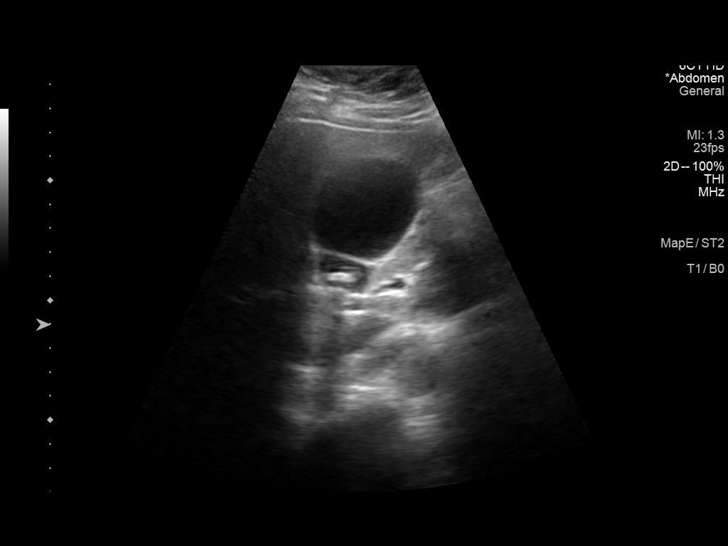

[14 of 25 positions shown; findings below may reference images not displayed]

FINDINGS: Gallbladder:

Gallstones measuring up to 1.3 cm noted. Gallbladder wall thickness
normal at 2.2 mm.

Common bile duct:

Diameter: 3.9 mm

Liver:

Increased hepatic echogenicity consistent fatty infiltration or
hepatocellular disease. No focal hepatic abnormality identified.
Portal vein is patent on color Doppler imaging with normal direction
of blood flow towards the liver.

Other: None.
IMPRESSION: 1.  Gallstones.  No biliary distention.

2. Increased hepatic echogenicity consistent fatty infiltration or
hepatocellular disease.
